# Patient Record
Sex: Female | Born: 1980 | Race: Asian | Hispanic: No | Marital: Single | Smoking: Never smoker
Health system: Southern US, Community
[De-identification: ages and names within clinical notes are randomized; demographics above are authoritative.]

## PROBLEM LIST (undated history)

## (undated) DIAGNOSIS — B181 Chronic viral hepatitis B without delta-agent: Secondary | ICD-10-CM

## (undated) HISTORY — PX: CYSTECTOMY: SUR359

## (undated) HISTORY — DX: Chronic viral hepatitis B without delta-agent: B18.1

## (undated) HISTORY — PX: DENTAL SURGERY: SHX609

---
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
## 2003-11-11 ENCOUNTER — Other Ambulatory Visit: Admission: RE | Admit: 2003-11-11 | Discharge: 2003-11-11 | Payer: Self-pay | Admitting: Gynecology

## 2003-11-12 ENCOUNTER — Ambulatory Visit (HOSPITAL_COMMUNITY): Admission: RE | Admit: 2003-11-12 | Discharge: 2003-11-12 | Payer: Self-pay | Admitting: Gynecology

## 2003-12-22 ENCOUNTER — Encounter (INDEPENDENT_AMBULATORY_CARE_PROVIDER_SITE_OTHER): Payer: Self-pay | Admitting: *Deleted

## 2003-12-22 ENCOUNTER — Inpatient Hospital Stay (HOSPITAL_COMMUNITY): Admission: RE | Admit: 2003-12-22 | Discharge: 2003-12-23 | Payer: Self-pay | Admitting: Gynecology

## 2004-12-05 ENCOUNTER — Inpatient Hospital Stay (HOSPITAL_COMMUNITY): Admission: AD | Admit: 2004-12-05 | Discharge: 2004-12-07 | Payer: Self-pay | Admitting: Obstetrics and Gynecology

## 2005-01-10 ENCOUNTER — Other Ambulatory Visit: Admission: RE | Admit: 2005-01-10 | Discharge: 2005-01-10 | Payer: Self-pay | Admitting: Obstetrics and Gynecology

## 2009-09-27 ENCOUNTER — Emergency Department (HOSPITAL_COMMUNITY): Admission: EM | Admit: 2009-09-27 | Discharge: 2009-09-27 | Payer: Self-pay | Admitting: Emergency Medicine

## 2010-05-27 NOTE — Discharge Summary (Signed)
Christina Schneider, Christina Schneider                  ACCOUNT NO.:  [id number]   MEDICAL RECORD NO.:  [id number]          PATIENT TYPE:  INP   LOCATION:  0451                         FACILITY:  [facility]   PHYSICIAN:  Howard C. Mezer, M.D.  DATE OF BIRTH:  January 19, 1980   DATE OF ADMISSION:  12/22/2003  DATE OF DISCHARGE:                                 DISCHARGE SUMMARY   HISTORY OF PRESENT ILLNESS:  The patient is a 30 year old with pelvic mass  for exploratory laparotomy on December 22, 2003.  The remainder of her  History and Physical are as previously dictated.   LABORATORY DATA AND X-RAY FINDINGS:  Preoperative hemoglobin 13.6, 5600  white blood cells.  Urinalysis within normal limits.   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  The patient did well postoperatively.  Diet and  ambulation were progressed over the evening of December 13, and early  morning of December 14.  On the morning of December 14, she was afebrile and  experiencing no problems except for pain and it was felt that she could be  discharged at this time.   DISCHARGE DIAGNOSES:  1.  Left hydrosalpinx.  2.  Left serocystic adenoma.   PROCEDURE:  Exploratory laparotomy with enterolysis and left salpingo-  oophorectomy.  Intraoperative pathology report revealed benign changes and  the diagnoses noted above.   DISPOSITION:  Discharged to home.   FOLLOW UP:  Return to the office in approximately 2 days for staple removal.   ACTIVITY:  She was instructed to gradually progress her activities over  several days at home and to limit lifting and driving x6 weeks.  She was  fully ambulatory.   DIET:  Regular.   CONDITION ON DISCHARGE:  Good.   DISCHARGE MEDICATIONS:  Prescription for Percocet generic #30, to be taken  1/2 to 1 q.4-6h. p.r.n. pain.     SRF/MEDQ  D:  12/23/2003  T:  12/23/2003  Job:  [id number]

## 2010-05-27 NOTE — H&P (Signed)
Christina Schneider, Christina Schneider                  ACCOUNT NO.:  [id number]   MEDICAL RECORD NO.:  [id number]          PATIENT TYPE:  INP   LOCATION:  NA                           FACILITY:  [facility]   PHYSICIAN:  Howard C. Mezer, M.D.  DATE OF BIRTH:  1980-02-08   DATE OF ADMISSION:  12/22/2003  DATE OF DISCHARGE:                                HISTORY & PHYSICAL   ADMITTING DIAGNOSIS:  Pelvic mass.   The patient is a 30 year old, nulligravid, Asian female, admitted with  pelvic mass for exploratory laparotomy.  The patient was first seen on  November 11, 2003, attempting to achieve pregnancy and on pelvic exam, a  pelvic mass was palpated.  Ultrasound examination revealed a complex left  adnexal mass, measuring 10.7 x 8.4 cm with solid and cystic components.  Assessment revealed an LDH of 149, quantitative hCG less than 2.0, AFP  marker at 3.1, a sedimentation rate of 5.  Prolactin 10.1 and CA 125 7.4.  The findings were discussed with Dr. Rande Brunt. Clarke-Pearson, and the  decision was made to schedule the patient for laparotomy for which he would  be available as a Artist.  A repeat ultrasound was performed on  December 17, 2003 and again, a complex cystic mass measuring 10.9 x 8.5 cm  was identified in the left adnexa.  An examination reveals no nodularity,  and the mass appears to be reasonably free.  Possible ovarian cancer,  possible mucinous cystadenoma have been discussed with the patient through  translators.  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  A TB test was obtained which was negative.  In a very  extensive conversation, the exploratory laparotomy with decision on incision-  type at the time of surgery, left salpingo-oophorectomy, question bilateral  salpingo-oophorectomy, question total abdominal hysterectomy have been  discussed in detail.  Potential complications including but not limited to  anesthesia; injury to the bowel, bladder,  ureters, possible fistula  formation, possible blood loss through transfusion, and its sequelae;  possible infection in the wound or the pelvis have been discussed in detail.  Hysterectomy may be necessary to remove the ovary or to deal with the  pathology or may become surgically necessary secondary to bleeding.  The  patient understands if a hysterectomy is performed that she will never be  able to become pregnant.  The patient accepts this possibility.  If the  lesion is cancerous, Dr. Reuel Boom L. Clarke-Pearson will be available to  perform the necessary procedures.  Postoperative expectations and  restrictions have been reviewed in detail.  Pain control has been discussed.  The translator at the preoperative visit was excellent.  There was a very  extended discussion about the details of the surgery and possible  consequences.  The patient understands that there is absolutely no guarantee  that she will be able to become pregnant in the future.  Bowel prep has been  discussed.  The patient appears to  have realistic expectations regarding  the surgery and understands the complexities of the surgery.  The  patient  understands that I will be leaving town the evening of the surgery.  This is  the only date that could be picked to accommodate Dr. Nelwyn Salisbury  schedule.  Dr. Jeanine Luz will provide postoperative care, and the patient  accepts this.   PAST MEDICAL HISTORY:   SURGICAL:  None.   MEDICAL:  Noncontributory.   ALLERGIES:  None known.   SOCIAL HISTORY:  The patient is employed second shift and attends [employer].  She  is a nonsmoker and does not use alcohol.   FAMILY HISTORY:  Positive for cardiovascular disease in the patient's mother  and negative for carcinoma.   PHYSICAL EXAMINATION:  HEENT:  Negative.  LUNGS:  Clear.  HEART:  Without murmurs.  BREASTS:  A small, milky discharge on the left, no masses and no nodes, and  no masses palpated.  ABDOMEN:  Soft and  nontender.  PELVIC EXAM:  BUS, vagina, and cervix normal.  The uterus is anterior and  mobile.  The right adnexa is negative, and the left adnexal area reveals a  large, mobile mass, and no nodularity is appreciated.  EXTREMITIES:  Negative.   IMPRESSION:  Pelvic mass.   PLAN:  Exploratory laparotomy with left salpingo-oophorectomy, question  total abdominal hysterectomy and bilateral salpingo-oophorectomy and  procedures as indicated by Dr. Rande Brunt. Clarke-Pearson if a malignancy is  identified.      HCM/MEDQ  D:  12/21/2003  T:  12/21/2003  Job:  [id number]

## 2010-05-27 NOTE — Op Note (Signed)
NAMENAVAEH, KEHRES                  ACCOUNT NO.:  [id number]   MEDICAL RECORD NO.:  [id number]          PATIENT TYPE:  INP   LOCATION:  0005                         FACILITY:  [facility]   PHYSICIAN:  Howard C. Mezer, M.D.  DATE OF BIRTH:  Nov 30, 1980   DATE OF PROCEDURE:  12/22/2003  DATE OF DISCHARGE:                                 OPERATIVE REPORT   PREOPERATIVE DIAGNOSIS:  Left adnexal mass.   POSTOPERATIVE DIAGNOSES:  1.  Left hydrosalpinx.  2.  Left ovarian cystadenoma.   OPERATION PERFORMED:  1.  Exploratory laparotomy.  2.  Left salpingo-oophorectomy.  3.  Lysis of adhesions.   SURGEON:  Dr. Teodora Medici   ASSISTANT:  Dr. Almedia Balls. Fore   ANESTHESIA:  General endotracheal.   PREPARATION:  Betadine.   DESCRIPTION OF PROCEDURE:  With the patient in the supine position, prepped  and draped in the routine fashion.  A Pfannenstiel incision was made through  the skin and subcutaneous tissue.  The fascia and peritoneum were opened  without difficulty.  Pelvic washings were obtained that were later  discarded.  A brief exploration of her upper abdomen revealed adhesions of  bowel and omentum to the anterior abdominal wall that were just very lightly  palpated so as not to disrupt these adhesion and cause bleeding.  Exploration of the pelvis revealed a large left cystic adnexal mass, densely  adherent to bowel, uterus, and pelvic sidewall.  The right ovary was  adherent to the pelvic sidewall but was otherwise normal.  The right  fallopian tube was not dilated; however, it was clubbed at the distal end,  and the distal end was adherent to the pelvic sidewall.  There were  significant adhesions in the pelvis, primarily in the cul-de-sac and  adherent to the posterior uterus.  It was very difficult at first to  establish normal anatomy and very slowly and carefully, the adhesions  aforementioned were taken down to expose the underside of the mass, the  lateral side of the mass,  and the superior side of the mass.  The pelvic  sidewall was then identifiable.  The round ligament was identifiable, and  the pelvic sidewall was opened from the round ligament to the  infundibulopelvic ligament.  The pelvic sidewall was opened, the sidewall  anatomy identified, the ureter identified, and traced to the level of the  uterine arteries.  The infundibulopelvic ligament was then isolated,  clamped, cut, and free tied with #1 chromic and suture ligated with #1  chromic.  At this point, the mass could be lifted superiorly and very slowly  and carefully, the adhesions were lysed.  The ureter was well out of harm's  way, and the uteroovarian ligament and fallopian tube were isolated as well  as possible.  A Heaney clamp was placed across the dissected area and the  specimen removed intact.  After the specimen was off the field, there was a  small leak.  The ovary was never definitely identified; however, there was a  mass inside the cystic mass that likely represented the ovary.  The pedicle  was suture ligated, tying fore and aft and because this represented the  vascular cornual area, two figure-of-eight sutures were placed over this  suture to assure hemostasis.  No effort was made to deal with the right  adnexal areas without causing the patient any adverse symptoms, and  permission had not been granted to perform a salpingectomy on the right  side.  Attention was then paid to hemostasis, primarily on the left pelvic  sidewall which was quite raw.  The ureter was again traced and multiple  bleeding points arrested, taking care not to damage the ureter or the bowel.  There was some very mild ooze that could not be arrested with cautery, and a  small piece of Gelfoam was placed.  This was placed away from the ureter,  and the peritoneum covered the ureter at this location.  At the completion  of the procedure with hemostasis intact, an effort was made to place the  large bowel in  the cul-de-sac.  There was minimal omentum to bring down.  The abdomen was then closed in layers using a running 2-0 Vicryl on the  peritoneum, running 0 Vicryl to midline bilaterally on the fascia.  Hemostasis was assured in the subcutaneous tissue, and the skin was closed  with staples.  The estimated blood loss was approximately 100 mL.  The  sponge, needle, and instrument counts were correct x 2.  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  The patient was given 100 mg of doxycycline intraoperatively, and  there were no signs of acute infection.  The patient tolerated the procedure  well and was taken to the recovery room in satisfactory condition.      HCM/MEDQ  D:  12/22/2003  T:  12/22/2003  Job:  [id number]

## 2011-02-01 ENCOUNTER — Encounter (HOSPITAL_COMMUNITY): Payer: Self-pay | Admitting: Emergency Medicine

## 2011-02-01 ENCOUNTER — Emergency Department (HOSPITAL_COMMUNITY)
Admission: EM | Admit: 2011-02-01 | Discharge: 2011-02-01 | Disposition: A | Discharge status: Home / Self Care | Payer: Commercial Managed Care - PPO | Source: Home / Self Care | Attending: Emergency Medicine | Admitting: Emergency Medicine

## 2011-02-01 DIAGNOSIS — J029 Acute pharyngitis, unspecified: Secondary | ICD-10-CM

## 2011-02-01 DIAGNOSIS — B085 Enteroviral vesicular pharyngitis: Secondary | ICD-10-CM

## 2011-02-01 MED ORDER — GUAIFENESIN-CODEINE 100-10 MG/5ML PO SYRP: 5.0000 mL | ORAL_SOLUTION | Freq: Three times a day (TID) | ORAL | Status: DC | PRN

## 2011-02-01 MED ORDER — BENZOCAINE-MENTHOL 6-10 MG MT LOZG: 1.0000 | LOZENGE | OROMUCOSAL | Status: AC | PRN

## 2011-02-01 MED ORDER — GUAIFENESIN-CODEINE 100-10 MG/5ML PO SYRP: 5.0000 mL | ORAL_SOLUTION | Freq: Three times a day (TID) | ORAL | Status: AC | PRN

## 2011-02-01 NOTE — ED Notes (Signed)
PT HERE WITH DRY COUGH,H/A AND FEVER THAT STARTED X 4 DYS AGO UNRELIEVED BY ROBITUSSIN OR DELSYM.VSS

## 2011-02-01 NOTE — ED Provider Notes (Addendum)
History     CSN: [id number]  Arrival date & time 02/01/11  7829   First MD Initiated Contact with Patient 02/01/11 0935      Chief Complaint  Patient presents with  . Cough  . Headache    (Consider location/radiation/quality/duration/timing/severity/associated sxs/prior treatment) HPI Comments: COUGH AND FEVER, sore throat"  X 4 days hurst to swallow, fevers started last night chest hurts when coughing" No SOB  Patient is a 31 y.o. female presenting with cough and headaches. The history is provided by the patient.  Cough This is a new problem. The current episode started more than 2 days ago. The problem occurs constantly. The problem has been gradually worsening. The cough is non-productive. Associated symptoms include chills, headaches, rhinorrhea and sore throat. Pertinent negatives include no myalgias, no shortness of breath and no wheezing. She has tried decongestants for the symptoms. The treatment provided no relief. She is not a smoker. Her past medical history is significant for asthma. Her past medical history does not include bronchitis.  Headache The primary symptoms include headaches.  The headache is not associated with neck stiffness.  Additional symptoms do not include neck stiffness.    History reviewed. No pertinent past medical history.  History reviewed. No pertinent past surgical history.  No family history on file.  History  Substance Use Topics  . Smoking status: Never Smoker   . Smokeless tobacco: Not on file  . Alcohol Use: No    OB History    Grav Para Term Preterm Abortions TAB SAB Ect Mult Living                  Review of Systems  Constitutional: Positive for chills.  HENT: Positive for congestion, sore throat and rhinorrhea. Negative for neck pain and neck stiffness.   Respiratory: Positive for cough. Negative for chest tightness, shortness of breath and wheezing.   Musculoskeletal: Negative for myalgias.  Neurological: Positive for  headaches.    Allergies  Review of patient's allergies indicates no known allergies.  Home Medications  No current outpatient prescriptions on file.  BP 112/81  Pulse 103  Temp(Src) 98.5 F (36.9 C) (Oral)  Resp 20  SpO2 100%  LMP 01/10/2011  Physical Exam  Nursing note and vitals reviewed. Constitutional: She appears well-developed and well-nourished. No distress.  HENT:  Head: Atraumatic.  Mouth/Throat: Uvula is midline. Posterior oropharyngeal edema present. No oropharyngeal exudate or tonsillar abscesses.    Eyes: Conjunctivae are normal. Right eye exhibits no discharge. Left eye exhibits no discharge.  Neck: Neck supple. No JVD present.  Pulmonary/Chest: No respiratory distress. She has no wheezes. She has no rales. She exhibits no tenderness.  Lymphadenopathy:    She has cervical adenopathy.  Skin: Skin is warm. No erythema.    ED Course  Procedures (including critical care time)   Labs Reviewed  POCT RAPID STREP A ([facility] CARE ONLY)   No results found.   No diagnosis found.    MDM  Pharyngitis with reactive LAD and ulcerative lesion soft palate.        Jimmie Molly, MD 02/01/11 1039  Jimmie Molly, MD 02/01/11 1049

## 2011-07-03 ENCOUNTER — Ambulatory Visit (INDEPENDENT_AMBULATORY_CARE_PROVIDER_SITE_OTHER): Payer: Commercial Managed Care - PPO | Admitting: Family Medicine

## 2011-07-03 VITALS — BP 107/74 | HR 69 | Temp 98.0°F | Resp 16 | Ht 60.0 in | Wt 106.8 lb

## 2011-07-03 DIAGNOSIS — M549 Dorsalgia, unspecified: Secondary | ICD-10-CM

## 2011-07-03 LAB — POCT CBC
Granulocyte percent: 67.7 %G (ref 37–80)
HCT, POC: 41.5 % (ref 37.7–47.9)
Hemoglobin: 13.7 g/dL (ref 12.2–16.2)
MCH, POC: 26.3 pg — AB (ref 27–31.2)
MCV: 79.7 fL — AB (ref 80–97)
POC Granulocyte: 5.6 (ref 2–6.9)
Platelet Count, POC: 265 10*3/uL (ref 142–424)
RBC: 5.21 M/uL (ref 4.04–5.48)
RDW, POC: 13.8 %
WBC: 8.3 10*3/uL (ref 4.6–10.2)

## 2011-07-03 NOTE — Progress Notes (Signed)
Patient Name: Christina Schneider Date of Birth: 1980/11/30 Medical Record Number: [id number] Gender: female Date of Encounter: 07/03/2011  History of Present Illness:  Christina Schneider is a 31 y.o. very pleasant female patient who presents with the following:  She would like a blood test for leg pain.  She notes "itching inside" her leg for a year or so- no rash. She feels that the itching is actually inside her leg.  She has also had intermittent leg pains for several years- she would like to have a blood test for this- ?exactly which test she would like to have.   She has been to [facility] ortho for this problem and has been given mobic which does help- she takes it as needed.  She has been told that she may have sciatics by a chiropractor.   There is no problem list on file for this patient.  No past medical history on file. No past surgical history on file. History  Substance Use Topics  . Smoking status: Never Smoker   . Smokeless tobacco: Not on file  . Alcohol Use: No   No family history on file. No Known Allergies  Medication list has been reviewed and updated.  Prior to Admission medications   Medication Sig Start Date End Date Taking? Authorizing Provider  meloxicam (MOBIC) 15 MG tablet Take 15 mg by mouth daily.   Yes Historical Provider, MD  benzocaine-menthol (CHLORASEPTIC) 6-10 MG lozenge Take 1 lozenge by mouth every 2 (two) hours as needed for pain. 02/01/11 02/01/12  Jimmie Molly, MD    Review of Systems:  As per HPI- otherwise negative.   Physical Examination: Filed Vitals:   07/03/11 1253  BP: 107/74  Pulse: 69  Temp: 98 F (36.7 C)  Resp: 16   Filed Vitals:   07/03/11 1253  Height: 5' (1.524 m)  Weight: 106 lb 12.8 oz (48.444 kg)   Body mass index is 20.86 kg/(m^2). Ideal Body Weight: Weight in (lb) to have BMI = 25: 127.7   GEN: WDWN, NAD, Non-toxic, A & O x 3 HEENT: Atraumatic, Normocephalic. Neck supple. No masses, No LAD. Ears and Nose: No  external deformity. CV: RRR, No M/G/R. No JVD. No thrill. No extra heart sounds. PULM: CTA B, no wheezes, crackles, rhonchi. No retractions. No resp. distress. No accessory muscle use. ABD: S, NT, ND, +BS. No rebound. No HSM. EXTR: No c/c/e NEURO Normal gait.  PSYCH: Normally interactive. Conversant. Not depressed or anxious appearing.  Calm demeanor.  Legs are normal with no swelling or tenderness. Back with normal ROM  Results for orders placed in visit on 07/03/11  POCT CBC      Component Value Range   WBC 8.3  4.6 - 10.2 K/uL   Lymph, poc 2.3  0.6 - 3.4   POC LYMPH PERCENT 27.3  10 - 50 %L   MID (cbc) 0.4  0 - 0.9   POC MID % 5.0  0 - 12 %M   POC Granulocyte 5.6  2 - 6.9   Granulocyte percent 67.7  37 - 80 %G   RBC 5.21  4.04 - 5.48 M/uL   Hemoglobin 13.7  12.2 - 16.2 g/dL   HCT, POC 62.1  30.8 - 47.9 %   MCV 79.7 (*) 80 - 97 fL   MCH, POC 26.3 (*) 27 - 31.2 pg   MCHC 33.0  31.8 - 35.4 g/dL   RDW, POC 65.7     Platelet Count, POC 265  142 - 424 K/uL   MPV 8.8  0 - 99.8 fL    Assessment and Plan: 1. Back pain  POCT CBC, Basic metabolic panel   Will check labs as above per her request.  Reassured her that I do not think her symptoms are due to her kidneys, but we do not mind checking a BMP if she likes.  She may continue to use mobic for likely MSK as needed  Abbe Amsterdam, MD

## 2011-07-04 ENCOUNTER — Encounter: Payer: Self-pay | Admitting: Family Medicine

## 2011-07-04 LAB — BASIC METABOLIC PANEL
CO2: 28 mEq/L (ref 19–32)
Calcium: 9.5 mg/dL (ref 8.4–10.5)
Chloride: 102 mEq/L (ref 96–112)
Glucose, Bld: 78 mg/dL (ref 70–99)
Potassium: 4.3 mEq/L (ref 3.5–5.3)
Sodium: 138 mEq/L (ref 135–145)

## 2013-01-09 NOTE — L&D Delivery Note (Signed)
Delivery Note At 3:28 PM a viable female was delivered via Vaginal, Spontaneous Delivery (Presentation: ; Right Occiput Anterior).  APGAR: 8, 9; weight .  Infant placed directly on mom's abdomen for bonding/skin-to-skin. Cord allowed to stop pulsing, and was clamped x 2, and cut by fob.   Placenta status: Intact, Spontaneous.  Cord: 3 vessels with the following complications: None.   Anesthesia: Epidural  Episiotomy: None Lacerations: None;Lt Periurethral- hemostatic w/o repair Suture Repair: n/a Est. Blood Loss (mL): 200  Mom to postpartum.  Baby to Couplet care / Skin to Skin. Plans to breast/bottlefeed, COCs for contraception  Marge DuncansBooker, Aubrionna Istre Randall 11/01/2013, 3:57 PM

## 2013-07-10 ENCOUNTER — Other Ambulatory Visit (HOSPITAL_COMMUNITY): Payer: Self-pay | Admitting: Physician Assistant

## 2013-07-10 DIAGNOSIS — Z3689 Encounter for other specified antenatal screening: Secondary | ICD-10-CM

## 2013-07-10 LAB — OB RESULTS CONSOLE HEPATITIS B SURFACE ANTIGEN: Hepatitis B Surface Ag: POSITIVE

## 2013-07-10 LAB — OB RESULTS CONSOLE GC/CHLAMYDIA
Chlamydia: NEGATIVE
GC PROBE AMP, GENITAL: NEGATIVE

## 2013-07-10 LAB — OB RESULTS CONSOLE ABO/RH: RH Type: POSITIVE

## 2013-07-10 LAB — OB RESULTS CONSOLE HIV ANTIBODY (ROUTINE TESTING): HIV: NONREACTIVE

## 2013-07-10 LAB — OB RESULTS CONSOLE RUBELLA ANTIBODY, IGM: Rubella: IMMUNE

## 2013-07-10 LAB — OB RESULTS CONSOLE RPR: RPR: NONREACTIVE

## 2013-07-10 LAB — OB RESULTS CONSOLE ANTIBODY SCREEN: Antibody Screen: NEGATIVE

## 2013-07-15 ENCOUNTER — Ambulatory Visit (HOSPITAL_COMMUNITY)
Admission: RE | Admit: 2013-07-15 | Discharge: 2013-07-15 | Disposition: A | Discharge status: Home / Self Care | Payer: Medicaid Other | Source: Ambulatory Visit | Attending: Physician Assistant | Admitting: Physician Assistant

## 2013-07-15 DIAGNOSIS — Z3689 Encounter for other specified antenatal screening: Secondary | ICD-10-CM

## 2013-09-18 ENCOUNTER — Other Ambulatory Visit (HOSPITAL_COMMUNITY): Payer: Self-pay | Admitting: Nurse Practitioner

## 2013-09-18 DIAGNOSIS — Z0374 Encounter for suspected problem with fetal growth ruled out: Secondary | ICD-10-CM

## 2013-09-23 ENCOUNTER — Ambulatory Visit (HOSPITAL_COMMUNITY)
Admission: RE | Admit: 2013-09-23 | Discharge: 2013-09-23 | Disposition: A | Discharge status: Home / Self Care | Payer: Medicaid Other | Source: Ambulatory Visit | Attending: Nurse Practitioner | Admitting: Nurse Practitioner

## 2013-09-23 DIAGNOSIS — Z0374 Encounter for suspected problem with fetal growth ruled out: Secondary | ICD-10-CM | POA: Diagnosis not present

## 2013-09-23 DIAGNOSIS — O26849 Uterine size-date discrepancy, unspecified trimester: Secondary | ICD-10-CM | POA: Diagnosis present

## 2013-10-09 LAB — OB RESULTS CONSOLE GBS: GBS: NEGATIVE

## 2013-11-01 ENCOUNTER — Inpatient Hospital Stay (HOSPITAL_COMMUNITY)
Admission: AD | Admit: 2013-11-01 | Discharge: 2013-11-02 | DRG: 775 | Disposition: A | Discharge status: Home / Self Care | Payer: Medicaid Other | Source: Ambulatory Visit | Attending: Obstetrics & Gynecology | Admitting: Obstetrics & Gynecology

## 2013-11-01 ENCOUNTER — Inpatient Hospital Stay (HOSPITAL_COMMUNITY): Payer: Medicaid Other | Admitting: Anesthesiology

## 2013-11-01 ENCOUNTER — Encounter (HOSPITAL_COMMUNITY): Payer: Self-pay

## 2013-11-01 ENCOUNTER — Encounter (HOSPITAL_COMMUNITY): Payer: Medicaid Other | Admitting: Anesthesiology

## 2013-11-01 DIAGNOSIS — Z3A39 39 weeks gestation of pregnancy: Secondary | ICD-10-CM | POA: Diagnosis present

## 2013-11-01 DIAGNOSIS — O99824 Streptococcus B carrier state complicating childbirth: Principal | ICD-10-CM | POA: Diagnosis present

## 2013-11-01 DIAGNOSIS — O0933 Supervision of pregnancy with insufficient antenatal care, third trimester: Secondary | ICD-10-CM

## 2013-11-01 DIAGNOSIS — O471 False labor at or after 37 completed weeks of gestation: Secondary | ICD-10-CM | POA: Diagnosis present

## 2013-11-01 DIAGNOSIS — IMO0001 Reserved for inherently not codable concepts without codable children: Secondary | ICD-10-CM

## 2013-11-01 LAB — CBC
HEMATOCRIT: 36 % (ref 36.0–46.0)
HEMOGLOBIN: 12.3 g/dL (ref 12.0–15.0)
MCH: 26.6 pg (ref 26.0–34.0)
MCHC: 34.2 g/dL (ref 30.0–36.0)
MCV: 77.9 fL — ABNORMAL LOW (ref 78.0–100.0)
Platelets: 175 10*3/uL (ref 150–400)
RBC: 4.62 MIL/uL (ref 3.87–5.11)
RDW: 14.6 % (ref 11.5–15.5)
WBC: 11 10*3/uL — ABNORMAL HIGH (ref 4.0–10.5)

## 2013-11-01 LAB — RPR

## 2013-11-01 LAB — TYPE AND SCREEN
ABO/RH(D): A POS
Antibody Screen: NEGATIVE

## 2013-11-01 LAB — ABO/RH: ABO/RH(D): A POS

## 2013-11-01 MED ORDER — LACTATED RINGERS IV SOLN
500.0000 mL | INTRAVENOUS | Status: DC | PRN
  Administered 2013-11-01: 500 mL via INTRAVENOUS

## 2013-11-01 MED ORDER — IBUPROFEN 600 MG PO TABS
600.0000 mg | ORAL_TABLET | Freq: Four times a day (QID) | ORAL | Status: DC
  Administered 2013-11-01 – 2013-11-02 (×3): 600 mg via ORAL
  Filled 2013-11-01 (×4): qty 1

## 2013-11-01 MED ORDER — EPHEDRINE 5 MG/ML INJ: 10.0000 mg | INTRAVENOUS | Status: DC | PRN

## 2013-11-01 MED ORDER — SODIUM CHLORIDE 0.9 % IV SOLN: 14.0000 mL/h | INTRAVENOUS | Status: DC | PRN

## 2013-11-01 MED ORDER — OXYTOCIN 40 UNITS IN LACTATED RINGERS INFUSION - SIMPLE MED: 62.5000 mL/h | INTRAVENOUS | Status: DC | PRN

## 2013-11-01 MED ORDER — OXYCODONE-ACETAMINOPHEN 5-325 MG PO TABS: 2.0000 | ORAL_TABLET | ORAL | Status: DC | PRN

## 2013-11-01 MED ORDER — SODIUM CHLORIDE 0.9 % IV SOLN: 250.0000 mL | INTRAVENOUS | Status: DC | PRN

## 2013-11-01 MED ORDER — MEASLES, MUMPS & RUBELLA VAC ~~LOC~~ INJ
0.5000 mL | INJECTION | Freq: Once | SUBCUTANEOUS | Status: DC
  Filled 2013-11-01: qty 0.5

## 2013-11-01 MED ORDER — LIDOCAINE HCL (PF) 1 % IJ SOLN
30.0000 mL | INTRAMUSCULAR | Status: DC | PRN
  Filled 2013-11-01: qty 30

## 2013-11-01 MED ORDER — OXYCODONE-ACETAMINOPHEN 5-325 MG PO TABS: 1.0000 | ORAL_TABLET | ORAL | Status: DC | PRN

## 2013-11-01 MED ORDER — SENNOSIDES-DOCUSATE SODIUM 8.6-50 MG PO TABS
2.0000 | ORAL_TABLET | ORAL | Status: DC
  Administered 2013-11-01: 2 via ORAL
  Filled 2013-11-01: qty 2

## 2013-11-01 MED ORDER — EPHEDRINE 5 MG/ML INJ
10.0000 mg | INTRAVENOUS | Status: DC | PRN
  Filled 2013-11-01: qty 2

## 2013-11-01 MED ORDER — BENZOCAINE-MENTHOL 20-0.5 % EX AERO: 1.0000 "application " | INHALATION_SPRAY | CUTANEOUS | Status: DC | PRN

## 2013-11-01 MED ORDER — DIPHENHYDRAMINE HCL 50 MG/ML IJ SOLN: 12.5000 mg | INTRAMUSCULAR | Status: DC | PRN

## 2013-11-01 MED ORDER — ONDANSETRON HCL 4 MG PO TABS: 4.0000 mg | ORAL_TABLET | ORAL | Status: DC | PRN

## 2013-11-01 MED ORDER — ONDANSETRON HCL 4 MG/2ML IJ SOLN: 4.0000 mg | INTRAMUSCULAR | Status: DC | PRN

## 2013-11-01 MED ORDER — OXYTOCIN BOLUS FROM INFUSION
500.0000 mL | INTRAVENOUS | Status: DC
  Administered 2013-11-01: 500 mL via INTRAVENOUS

## 2013-11-01 MED ORDER — PRENATAL MULTIVITAMIN CH
1.0000 | ORAL_TABLET | Freq: Every day | ORAL | Status: DC
  Administered 2013-11-02: 1 via ORAL
  Filled 2013-11-01: qty 1

## 2013-11-01 MED ORDER — LACTATED RINGERS IV SOLN
INTRAVENOUS | Status: DC
  Administered 2013-11-01: 12:00:00 via INTRAVENOUS

## 2013-11-01 MED ORDER — LACTATED RINGERS IV SOLN: 500.0000 mL | Freq: Once | INTRAVENOUS | Status: DC

## 2013-11-01 MED ORDER — FLEET ENEMA 7-19 GM/118ML RE ENEM: 1.0000 | ENEMA | Freq: Every day | RECTAL | Status: DC | PRN

## 2013-11-01 MED ORDER — TETANUS-DIPHTH-ACELL PERTUSSIS 5-2.5-18.5 LF-MCG/0.5 IM SUSP: 0.5000 mL | Freq: Once | INTRAMUSCULAR | Status: DC

## 2013-11-01 MED ORDER — FENTANYL CITRATE 0.05 MG/ML IJ SOLN
100.0000 ug | INTRAMUSCULAR | Status: DC | PRN
  Administered 2013-11-01: 100 ug via INTRAVENOUS
  Filled 2013-11-01: qty 2

## 2013-11-01 MED ORDER — CITRIC ACID-SODIUM CITRATE 334-500 MG/5ML PO SOLN: 30.0000 mL | ORAL | Status: DC | PRN

## 2013-11-01 MED ORDER — FENTANYL 2.5 MCG/ML BUPIVACAINE 1/10 % EPIDURAL INFUSION (WH - ANES)
14.0000 mL/h | INTRAMUSCULAR | Status: DC | PRN
  Administered 2013-11-01: 14 mL/h via EPIDURAL
  Filled 2013-11-01: qty 125

## 2013-11-01 MED ORDER — WITCH HAZEL-GLYCERIN EX PADS: 1.0000 "application " | MEDICATED_PAD | CUTANEOUS | Status: DC | PRN

## 2013-11-01 MED ORDER — PHENYLEPHRINE 40 MCG/ML (10ML) SYRINGE FOR IV PUSH (FOR BLOOD PRESSURE SUPPORT)
80.0000 ug | PREFILLED_SYRINGE | INTRAVENOUS | Status: DC | PRN
  Filled 2013-11-01: qty 10
  Filled 2013-11-01: qty 2

## 2013-11-01 MED ORDER — SODIUM CHLORIDE 0.9 % IJ SOLN
3.0000 mL | Freq: Two times a day (BID) | INTRAMUSCULAR | Status: DC
  Administered 2013-11-01: 10 mL via INTRAVENOUS

## 2013-11-01 MED ORDER — SIMETHICONE 80 MG PO CHEW: 80.0000 mg | CHEWABLE_TABLET | ORAL | Status: DC | PRN

## 2013-11-01 MED ORDER — LIDOCAINE HCL (PF) 1 % IJ SOLN
INTRAMUSCULAR | Status: DC | PRN
  Administered 2013-11-01 (×2): 5 mL

## 2013-11-01 MED ORDER — DIPHENHYDRAMINE HCL 25 MG PO CAPS: 25.0000 mg | ORAL_CAPSULE | Freq: Four times a day (QID) | ORAL | Status: DC | PRN

## 2013-11-01 MED ORDER — BISACODYL 10 MG RE SUPP: 10.0000 mg | Freq: Every day | RECTAL | Status: DC | PRN

## 2013-11-01 MED ORDER — DIBUCAINE 1 % RE OINT: 1.0000 "application " | TOPICAL_OINTMENT | RECTAL | Status: DC | PRN

## 2013-11-01 MED ORDER — ZOLPIDEM TARTRATE 5 MG PO TABS: 5.0000 mg | ORAL_TABLET | Freq: Every evening | ORAL | Status: DC | PRN

## 2013-11-01 MED ORDER — PHENYLEPHRINE 40 MCG/ML (10ML) SYRINGE FOR IV PUSH (FOR BLOOD PRESSURE SUPPORT)
80.0000 ug | PREFILLED_SYRINGE | INTRAVENOUS | Status: DC | PRN
  Filled 2013-11-01: qty 2

## 2013-11-01 MED ORDER — PHENYLEPHRINE 40 MCG/ML (10ML) SYRINGE FOR IV PUSH (FOR BLOOD PRESSURE SUPPORT): 80.0000 ug | PREFILLED_SYRINGE | INTRAVENOUS | Status: DC | PRN

## 2013-11-01 MED ORDER — OXYTOCIN 40 UNITS IN LACTATED RINGERS INFUSION - SIMPLE MED
62.5000 mL/h | INTRAVENOUS | Status: DC
  Filled 2013-11-01: qty 1000

## 2013-11-01 MED ORDER — ONDANSETRON HCL 4 MG/2ML IJ SOLN: 4.0000 mg | Freq: Four times a day (QID) | INTRAMUSCULAR | Status: DC | PRN

## 2013-11-01 MED ORDER — LANOLIN HYDROUS EX OINT: TOPICAL_OINTMENT | CUTANEOUS | Status: DC | PRN

## 2013-11-01 MED ORDER — ACETAMINOPHEN 325 MG PO TABS: 650.0000 mg | ORAL_TABLET | ORAL | Status: DC | PRN

## 2013-11-01 MED ORDER — SODIUM CHLORIDE 0.9 % IJ SOLN: 3.0000 mL | INTRAMUSCULAR | Status: DC | PRN

## 2013-11-01 NOTE — Anesthesia Preprocedure Evaluation (Addendum)
Anesthesia Evaluation  Patient identified by MRN, date of birth, ID band Patient awake    Reviewed: Allergy & Precautions, H&P , Patient's Chart, lab work & pertinent test results  Airway Mallampati: III TM Distance: >3 FB Neck ROM: full    Dental   Pulmonary  breath sounds clear to auscultation        Cardiovascular Rhythm:regular Rate:Normal     Neuro/Psych    GI/Hepatic (+) B  Endo/Other    Renal/GU      Musculoskeletal   Abdominal   Peds  Hematology   Anesthesia Other Findings   Reproductive/Obstetrics (+) Pregnancy                          Anesthesia Physical Anesthesia Plan  ASA: III  Anesthesia Plan: Epidural   Post-op Pain Management:    Induction:   Airway Management Planned:   Additional Equipment:   Intra-op Plan:   Post-operative Plan:   Informed Consent: I have reviewed the patients History and Physical, chart, labs and discussed the procedure including the risks, benefits and alternatives for the proposed anesthesia with the patient or authorized representative who has indicated his/her understanding and acceptance.     Plan Discussed with:   Anesthesia Plan Comments:         Anesthesia Quick Evaluation

## 2013-11-01 NOTE — Progress Notes (Signed)
Dr. Debroah LoopArnold answered for [facility], CNM. Notified of pt's cervical exam, u/c's q3, will admit to birthing suites.

## 2013-11-01 NOTE — MAU Note (Signed)
Per HMitchell, RN charge, pt to go to room 164. EFM tracing reviewed.

## 2013-11-01 NOTE — Anesthesia Procedure Notes (Signed)
Epidural Patient location during procedure: OB Start time: 11/01/2013 1:37 PM  Staffing Anesthesiologist: Brayton CavesJACKSON, Morayo Leven Performed by: anesthesiologist   Preanesthetic Checklist Completed: patient identified, site marked, surgical consent, pre-op evaluation, timeout performed, IV checked, risks and benefits discussed and monitors and equipment checked  Epidural Patient position: sitting Prep: site prepped and draped and DuraPrep Patient monitoring: continuous pulse ox and blood pressure Approach: midline Location: L3-L4 Injection technique: LOR air  Needle:  Needle type: Tuohy  Needle gauge: 17 G Needle length: 9 cm and 9 Needle insertion depth: 3 cm Catheter type: closed end flexible Catheter size: 19 Gauge Catheter at skin depth: 10 cm Test dose: negative  Assessment Events: blood not aspirated, injection not painful, no injection resistance, negative IV test and no paresthesia  Additional Notes Patient identified.  Risk benefits discussed including failed block, incomplete pain control, headache, nerve damage, paralysis, blood pressure changes, nausea, vomiting, reactions to medication both toxic or allergic, and postpartum back pain.  Patient expressed understanding and wished to proceed.  All questions were answered.  Sterile technique used throughout procedure and epidural site dressed with sterile barrier dressing. No paresthesia or other complications noted.The patient did not experience any signs of intravascular injection such as tinnitus or metallic taste in mouth nor signs of intrathecal spread such as rapid motor block. Please see nursing notes for vital signs.

## 2013-11-01 NOTE — H&P (Signed)
Christina Schneider is a 33 y.o. 442P1001 female at 5378w2d by LMP c/w 23wk u/s, presenting in active labor.   Reports active fetal movement, contractions: regular, vaginal bleeding: none, membranes: intact. Initiated [facility] care at [facility] at 23 wks.   Pregnancy complicated by HBsAg Pos, Hgb E trait pos, late care @ 23wks.  Past Medical History: Past Medical History  Diagnosis Date  . Hepatitis B carrier     Past Surgical History: Past Surgical History  Procedure Laterality Date  . Dental surgery    . Cystectomy      Obstetrical History: OB History   Grav Para Term Preterm Abortions TAB SAB Ect Mult Living   2 1 1       1       Social History: History   Social History  . Marital Status: Single    Spouse Name: N/A    Number of Children: N/A  . Years of Education: N/A   Social History Main Topics  . Smoking status: Never Smoker   . Smokeless tobacco: Never Used  . Alcohol Use: No  . Drug Use: No  . Sexual Activity: Yes    Birth Control/ Protection: None   Other Topics Concern  . None   Social History Narrative  . None    Family History: History reviewed. No pertinent family history.  Allergies: No Known Allergies  Prescriptions prior to admission  Medication Sig Dispense Refill  . Prenatal Vit-Fe Fumarate-FA (PRENATAL MULTIVITAMIN) TABS tablet Take 1 tablet by mouth daily at 12 noon.         Review of Systems  Pertinent pos/neg as indicated in HPI    Height 5' (1.524 m), weight 57.38 kg (126 lb 8 oz). General appearance: alert, cooperative and no distress Lungs: clear to auscultation bilaterally Heart: regular rate and rhythm Abdomen: gravid, soft, non-tender Extremities: No edema DTR's 2+  Fetal monitoring: FHR: 135 bpm, variability: moderate,  Accelerations: Present,  decelerations:  Absent Uterine activity: q 2-534mins  Dilation: 3 Effacement (%): 90 Station: -2 Exam by:: Principal FinancialSBeck, RN Presentation: cephalic  1305: 5/90/-2, bbow   Prenatal  labs: ABO, Rh:  A+ Antibody:  neg Rubella:  immune RPR:   neg HBsAg:   POS HIV:   non-reactive GBS:   negative  1 hr Glucola: 85 Genetic screening:  Too late Anatomy US: normal  Assessment:  6378w2d SIUP  G2P1001  Active labor   Cat 1 FHR  GBS  neg  Hep B carrier  Hgb E trait +  Plan:  Admit to BS  IV pain meds/epidural prn active labor  Expectant management  Anticipate NSVB   Plans to breast/bottlefeed  Contraception: POPs  Circumcision: n/a  Marge DuncansBooker, Kimberly Randall CNM, WHNP-BC 11/01/2013, 12:21 PM

## 2013-11-01 NOTE — MAU Note (Signed)
Pt states here for eval of contractions q3-5 minutes apart. No leaking of fluid. Bloody show.

## 2013-11-02 NOTE — Anesthesia Postprocedure Evaluation (Signed)
Anesthesia Post Note  Patient: Christina Schneider  Procedure(s) Performed: * No procedures listed *  Anesthesia type: Epidural  Patient location: Mother/Baby  Post pain: Pain level controlled  Post assessment: Post-op Vital signs reviewed  Last Vitals:  Filed Vitals:   11/02/13 0715  BP: 101/65  Pulse: 63  Temp: 36.6 C  Resp: 17    Post vital signs: Reviewed  Level of consciousness:alert  Complications: No apparent anesthesia complications

## 2013-11-02 NOTE — Lactation Note (Signed)
This note was copied from the chart of Girl Shantella Aronoff. Lactation Consultation Note  Patient Name: Girl Norton Pastelyi Croslin Today's Date: 11/02/2013 Reason for consult: Initial assessment Baby 22 hours of life. Mom states that she nursed and formula fed her first child and she is planning to do the same for this child. Enc mom to offer lots of STS and nurse with cues. [facility] given [facility], aware of OP/BFSG, community resources, and [facility] phone line services.   Maternal Data Does the patient have breastfeeding experience prior to this delivery?: Yes  Feeding Feeding Type: Bottle Fed - Formula Nipple Type: Slow - flow  LATCH Score/Interventions                      Lactation Tools Discussed/Used     Consult Status Consult Status: PRN    Geralynn OchsWILLIARD, Carlous Olivares 11/02/2013, 2:14 PM

## 2013-11-02 NOTE — Discharge Summary (Signed)
Obstetric Discharge Summary Reason for Admission: onset of labor Prenatal Procedures: none Intrapartum Procedures: spontaneous vaginal delivery Postpartum Procedures: none Complications-Operative and Postpartum: none  Hospital Course:  Patient admitted in active labor, progressed quickly without complication.   Delivery Note At 3:28 PM a viable female was delivered via Vaginal, Spontaneous Delivery (Presentation: ; Right Occiput Anterior). APGAR: 8, 9; weight . Infant placed directly on mom's abdomen for bonding/skin-to-skin. Cord allowed to stop pulsing, and was clamped x 2, and cut by fob.  Placenta status: Intact, Spontaneous. Cord: 3 vessels with the following complications: None.  Anesthesia: Epidural  Episiotomy: None  Lacerations: None;Lt Periurethral- hemostatic w/o repair  Suture Repair: n/a  Est. Blood Loss (mL): 200  Mom to postpartum. Baby to Couplet care / Skin to Skin.  Plans to breast/bottlefeed, COCs for contraception  Marge DuncansBooker, Keiyon Plack Randall  11/01/2013, 3:57 PM  Postpartum, the patient did well with no complications.  On the day of discharge,  Pt denied problems with ambulating, voiding or po intake.  She denied nausea or vomiting.  Pain wass well controlled.  She has had flatus. She has not had bowel movement.  Lochia Small.  Plan for birth control is oral contraceptives (estrogen/progesterone).    H/H: Lab Results  Component Value Date/Time   HGB 12.3 11/01/2013 12:16 PM   HGB 13.7 07/03/2011  1:43 PM   HCT 36.0 11/01/2013 12:16 PM   HCT 41.5 07/03/2011  1:43 PM    Filed Vitals:   11/01/13 2330  BP: 107/62  Pulse: 67  Temp: 98.1 F (36.7 C)  Resp: 18    Physical Exam: VSS NAD Abd: Appropriately tender, ND, Fundus firm No c/c/e, Neg homan's sign, neg cords Lochia Appropriate  Discharge Diagnoses: Term Pregnancy-delivered  Discharge Information: Date: 11/02/2013 Activity: pelvic rest Diet: routine  Medications: PNV Breast feeding:   Yes Condition: stable Instructions: refer to handout Discharge to: home      Medication List         prenatal multivitamin Tabs tablet  Take 1 tablet by mouth daily at 12 noon.           Follow-up Information   [facility] [place]. Schedule an appointment as soon as possible for a visit in 6 weeks. (for postpartum follow up)    Contact information:   [address] [place] [id number] [phone]      Jacquiline DoeParker, Caleb 11/02/2013,6:58 AM  I spoke with and examined patient and agree with resident/PA/SNM's note and plan of care.  Contraception will be POPs as pt is breastfeeding. Desires d/c today after baby's 24hr labs.  Cheral MarkerKimberly R. Sherryann Frese, CNM, [facility] 11/02/2013 7:51 AM

## 2013-11-02 NOTE — Discharge Instructions (Signed)

## 2013-11-03 NOTE — Progress Notes (Signed)
Ur chart review completed.  

## 2013-11-10 ENCOUNTER — Encounter (HOSPITAL_COMMUNITY): Payer: Self-pay

## 2016-07-19 ENCOUNTER — Ambulatory Visit (INDEPENDENT_AMBULATORY_CARE_PROVIDER_SITE_OTHER): Payer: Self-pay

## 2016-07-19 ENCOUNTER — Ambulatory Visit (INDEPENDENT_AMBULATORY_CARE_PROVIDER_SITE_OTHER): Payer: Self-pay | Admitting: Physician Assistant

## 2016-07-19 ENCOUNTER — Encounter: Payer: Self-pay | Admitting: Physician Assistant

## 2016-07-19 VITALS — BP 124/79 | HR 75 | Temp 98.0°F | Resp 17 | Ht 60.5 in | Wt 112.0 lb

## 2016-07-19 DIAGNOSIS — R0602 Shortness of breath: Secondary | ICD-10-CM

## 2016-07-19 DIAGNOSIS — F458 Other somatoform disorders: Secondary | ICD-10-CM

## 2016-07-19 DIAGNOSIS — R0989 Other specified symptoms and signs involving the circulatory and respiratory systems: Secondary | ICD-10-CM

## 2016-07-19 DIAGNOSIS — R0789 Other chest pain: Secondary | ICD-10-CM

## 2016-07-19 LAB — POCT CBC
Granulocyte percent: 61.4 %G (ref 37–80)
HEMATOCRIT: 39.3 % (ref 37.7–47.9)
Hemoglobin: 13.1 g/dL (ref 12.2–16.2)
Lymph, poc: 1.7 (ref 0.6–3.4)
MCH, POC: 26.5 pg — AB (ref 27–31.2)
MCHC: 33.3 g/dL (ref 31.8–35.4)
MCV: 79.6 fL — AB (ref 80–97)
MID (cbc): 0.2 (ref 0–0.9)
MPV: 7.7 fL (ref 0–99.8)
PLATELET COUNT, POC: 231 10*3/uL (ref 142–424)
POC Granulocyte: 3.1 (ref 2–6.9)
POC LYMPH %: 33.9 % (ref 10–50)
POC MID %: 4.7 %M (ref 0–12)
RBC: 4.94 M/uL (ref 4.04–5.48)
RDW, POC: 12.9 %
WBC: 5.1 10*3/uL (ref 4.6–10.2)

## 2016-07-19 MED ORDER — RANITIDINE HCL 150 MG PO TABS: 150.0000 mg | ORAL_TABLET | Freq: Two times a day (BID) | ORAL | 0 refills | Status: DC

## 2016-07-19 NOTE — Progress Notes (Signed)
Patient ID: Christina Schneider, female     DOB: 05/05/1980, 36 y.o.    MRN: [id number]  PCP: Teodora MediciMezer, Howard, MD  Chief Complaint  Patient presents with  . Shortness of Breath    Subjective:   This patient is new to this practice and presents for evaluation of shortness of breath. She is accompanied by her 34 year old daughter.  SOB began this morning, noted upon waking. She used an albuterol inhaler prescribed 5-6 months ago for an illness accompanied by wheezing, and found it unhelpful. She describes the SOB as a "heaviness" in the chest.  She denies URI-type symptoms, cough, HA, dizziness, nausea, vomiting, diarrhea, urinary symptoms, joint pain. She initially reported no muscle pain, but then related RIGHT sided low back pain sometimes at night/early morning while lying down, that resolves when she gets up and moving.  Quit birth control about 4 months ago. LMP ended yesterday.   Review of Systems  Constitutional: Negative.   HENT: Negative.   Eyes: Negative.   Respiratory: Positive for shortness of breath. Negative for apnea, cough, choking, chest tightness, wheezing and stridor.   Cardiovascular: Negative.   Gastrointestinal: Negative.   Endocrine: Negative.   Genitourinary: Negative.   Musculoskeletal: Positive for back pain. Negative for arthralgias, gait problem, joint swelling, myalgias, neck pain and neck stiffness.  Skin: Negative.   Allergic/Immunologic: Negative.   Neurological: Negative.   Hematological: Negative.   Psychiatric/Behavioral: Negative.      Prior to Admission medications   Not on File     No Known Allergies   There are no active problems to display for this patient.    No family history on file.   Social History   Social History  . Marital status: Single    Spouse name: N/A  . Number of children: 2  . Years of education: N/A   Occupational History  . [employer]   Social History Main Topics  . Smoking status: Never  Smoker  . Smokeless tobacco: Never Used  . Alcohol use No  . Drug use: No  . Sexual activity: Yes    Birth control/ protection: None   Other Topics Concern  . Not on file   Social History Narrative   Originally from [place].   Came to the [place] in 2002.   Lives with her boyfriend and her two daughters.         Objective:  Physical Exam  Constitutional: She is oriented to person, place, and time. She appears well-developed and well-nourished. She is active and cooperative. No distress.  BP 124/79   Pulse 75   Temp 98 F (36.7 C) (Oral)   Resp 17   Ht 5' 0.5" (1.537 m)   Wt 112 lb (50.8 kg)   LMP 07/18/2016 (Approximate)   SpO2 98%   Breastfeeding? No   BMI 21.51 kg/m   HENT:  Head: Normocephalic and atraumatic.  Right Ear: Hearing and external ear normal.  Left Ear: Hearing and external ear normal.  Nose: Nose normal.  Mouth/Throat: Oropharynx is clear and moist.  Eyes: Conjunctivae and EOM are normal. Pupils are equal, round, and reactive to light. No scleral icterus.  Neck: Normal range of motion. Neck supple. No thyromegaly present.  Cardiovascular: Normal rate, regular rhythm and normal heart sounds.   Pulses:      Radial pulses are 2+ on the right side, and 2+ on the left side.  Pulmonary/Chest: Effort normal and breath sounds normal.  Abdominal:  Soft. Bowel sounds are normal. She exhibits no distension and no mass. There is no tenderness. There is no rebound and no guarding.  Musculoskeletal:       Back:  Lymphadenopathy:       Head (right side): No tonsillar, no preauricular, no posterior auricular and no occipital adenopathy present.       Head (left side): No tonsillar, no preauricular, no posterior auricular and no occipital adenopathy present.    She has no cervical adenopathy.       Right: No supraclavicular adenopathy present.       Left: No supraclavicular adenopathy present.  Neurological: She is alert and oriented to person, place, and time. No  sensory deficit.  Skin: Skin is warm, dry and intact. No rash noted. No cyanosis or erythema. Nails show no clubbing.  Psychiatric: She has a normal mood and affect. Her speech is normal and behavior is normal.    EKG reviewed with Dr. Alvy Bimler. NSR. No ischemia or LVH. Rate 71 bpm. PR 156. QT 408. No previous tracings for comparison.   Results for orders placed or performed in visit on 07/19/16  POCT CBC  Result Value Ref Range   WBC 5.1 4.6 - 10.2 K/uL   Lymph, poc 1.7 0.6 - 3.4   POC LYMPH PERCENT 33.9 10 - 50 %L   MID (cbc) 0.2 0 - 0.9   POC MID % 4.7 0 - 12 %M   POC Granulocyte 3.1 2 - 6.9   Granulocyte percent 61.4 37 - 80 %G   RBC 4.94 4.04 - 5.48 M/uL   Hemoglobin 13.1 12.2 - 16.2 g/dL   HCT, POC 16.1 09.6 - 47.9 %   MCV 79.6 (A) 80 - 97 fL   MCH, POC 26.5 (A) 27 - 31.2 pg   MCHC 33.3 31.8 - 35.4 g/dL   RDW, POC 04.5 %   Platelet Count, POC 231 142 - 424 K/uL   MPV 7.7 0 - 99.8 fL    Dg Chest 2 View  Result Date: 07/19/2016 CLINICAL DATA:  Shortness of Breath EXAM: CHEST  2 VIEW COMPARISON:  None. FINDINGS: There is a granuloma in the left lower lobe. No edema or consolidation. Heart size and pulmonary vascularity are normal. No adenopathy. No bone lesions. IMPRESSION: Small granuloma left lower lobe.  No edema or consolidation. Electronically Signed   By: Bretta Bang III M.D.   On: 07/19/2016 15:24        Assessment & Plan:  1. Shortness of breath 2. Chest heaviness Normal EKG, CBC and chest radiographs. Low risk for PE, cardiac cause. Anticipate resolution of symptoms without cause identified. Instructed to return here or go to the ED if her symptoms worsen or persist. - DG Chest 2 View; Future - POCT CBC - EKG 12-Lead  3. Globus sensation After I reviewed the results of her evaluation, and advised there is no identified cause of her symptoms (SOB and chest heaviness), she related that she is experiencing the sensation that something is stuck in her  throat, and that is what she was trying to describe. Trial of H2 blocker. RTC if symptoms worsen/persist. - ranitidine (ZANTAC) 150 MG tablet; Take 1 tablet (150 mg total) by mouth 2 (two) times daily.  Dispense: 60 tablet; Refill: 0    Return if symptoms worsen or fail to improve.   Fernande Bras, PA-C Primary Care at [facility]

## 2016-07-19 NOTE — Patient Instructions (Addendum)
The electrocardiogram is NORMAL. The blood count is NORMAL. The chest radiographs show no cause for your pain. There is a granuloma in the left lower lung, most likely scarring from a previous infection.  Please rest and drink plenty of water. If the symptoms persist, or worsen, please return here or go to the emergency room.   IF you received an x-ray today, you will receive an invoice from [facility]. Please contact [facility] at [phone] with questions or concerns regarding your invoice.   IF you received labwork today, you will receive an invoice from [facility]. Please contact [facility] at [phone] with questions or concerns regarding your invoice.   Our billing staff will not be able to assist you with questions regarding bills from these companies.  You will be contacted with the lab results as soon as they are available. The fastest way to get your results is to activate your My Chart account. Instructions are located on the last page of this paperwork. If you have not heard from us regarding the results in 2 weeks, please contact this office.

## 2017-06-11 ENCOUNTER — Encounter: Payer: Self-pay | Admitting: Physician Assistant

## 2017-06-11 ENCOUNTER — Ambulatory Visit (INDEPENDENT_AMBULATORY_CARE_PROVIDER_SITE_OTHER): Payer: Self-pay

## 2017-06-11 ENCOUNTER — Ambulatory Visit: Payer: Self-pay | Admitting: Physician Assistant

## 2017-06-11 ENCOUNTER — Other Ambulatory Visit: Payer: Self-pay

## 2017-06-11 VITALS — BP 114/81 | HR 87 | Temp 98.5°F | Ht 60.43 in | Wt 118.6 lb

## 2017-06-11 DIAGNOSIS — K2900 Acute gastritis without bleeding: Secondary | ICD-10-CM

## 2017-06-11 DIAGNOSIS — R109 Unspecified abdominal pain: Secondary | ICD-10-CM

## 2017-06-11 LAB — POCT URINE PREGNANCY: PREG TEST UR: NEGATIVE

## 2017-06-11 LAB — POCT URINALYSIS DIP (MANUAL ENTRY)
Bilirubin, UA: NEGATIVE
Blood, UA: NEGATIVE
Glucose, UA: NEGATIVE mg/dL
Ketones, POC UA: NEGATIVE mg/dL
Leukocytes, UA: NEGATIVE
Nitrite, UA: NEGATIVE
PH UA: 7.5 (ref 5.0–8.0)
PROTEIN UA: NEGATIVE mg/dL
SPEC GRAV UA: 1.02 (ref 1.010–1.025)
UROBILINOGEN UA: 0.2 U/dL

## 2017-06-11 LAB — POCT CBC
Granulocyte percent: 65.4 %G (ref 37–80)
HCT, POC: 39.2 % (ref 37.7–47.9)
Hemoglobin: 12.9 g/dL (ref 12.2–16.2)
Lymph, poc: 2.2 (ref 0.6–3.4)
MCH, POC: 26.9 pg — AB (ref 27–31.2)
MCHC: 33.1 g/dL (ref 31.8–35.4)
MCV: 81.4 fL (ref 80–97)
MID (cbc): 0.4 (ref 0–0.9)
MPV: 6.9 fL (ref 0–99.8)
PLATELET COUNT, POC: 284 10*3/uL (ref 142–424)
POC Granulocyte: 4.9 (ref 2–6.9)
POC LYMPH %: 28.9 % (ref 10–50)
POC MID %: 5.7 %M (ref 0–12)
RBC: 4.81 M/uL (ref 4.04–5.48)
RDW, POC: 12.4 %
WBC: 7.5 10*3/uL (ref 4.6–10.2)

## 2017-06-11 MED ORDER — RANITIDINE HCL 150 MG PO TABS: 150.0000 mg | ORAL_TABLET | Freq: Two times a day (BID) | ORAL | 0 refills | Status: DC

## 2017-06-11 NOTE — Progress Notes (Signed)
Christina Schneider  MRN: [id number] DOB: Dec 18, 1980  Subjective:   Christina Schneider is a 37 y.o. female who presents for evaluation of abdominal pain. Onset was 5 days ago. Symptoms have been unchanged. The pain is described as aching, and is 7/10 in intensity. Pain is  "all over"   Aggravating factors: eating hard food in epigastric region.  Alleviating factors: heating pad and gas. Associated symptoms: none. The patient denies arthralagias, belching, chills, constipation, diarrhea, dysuria, fever, frequency, flank pain, headache, hematochezia, hematuria, melena, myalgias, nausea, sweats and vomiting. Last normal bowel movement was this morning.  Eats mostly vegetables. Drinks mostly water. No alcohol. Does note she took ibuprofen and advil pretty frequently last week for overall pain. No odd food exposure. NO recent travel.  LMP 05/22/17. She is sexually active with monogamous partner. No PSH.  Review of Systems  Per HPI  There are no active problems to display for this patient.   Current Outpatient Medications on File Prior to Visit  Medication Sig Dispense Refill  . ranitidine (ZANTAC) 150 MG tablet Take 1 tablet (150 mg total) by mouth 2 (two) times daily. 60 tablet 0   No current facility-administered medications on file prior to visit.     No Known Allergies    Social History   Socioeconomic History  . Marital status: Single    Spouse name: Not on file  . Number of children: 2  . Years of education: Not on file  . Highest education level: Not on file  Occupational History  . Occupation: WAITRESS    Employer: [employer]  Social Needs  . Financial resource strain: Not on file  . Food insecurity:    Worry: Not on file    Inability: Not on file  . Transportation needs:    Medical: Not on file    Non-medical: Not on file  Tobacco Use  . Smoking status: Never Smoker  . Smokeless tobacco: Never Used  Substance and Sexual Activity  . Alcohol use: No  . Drug use: No  . Sexual  activity: Yes    Birth control/protection: None  Lifestyle  . Physical activity:    Days per week: Not on file    Minutes per session: Not on file  . Stress: Not on file  Relationships  . Social connections:    Talks on phone: Not on file    Gets together: Not on file    Attends religious service: Not on file    Active member of club or organization: Not on file    Attends meetings of clubs or organizations: Not on file    Relationship status: Not on file  . Intimate partner violence:    Fear of current or ex partner: Not on file    Emotionally abused: Not on file    Physically abused: Not on file    Forced sexual activity: Not on file  Other Topics Concern  . Not on file  Social History Narrative   Originally from [place].   Came to the [place] in 2002.   Lives with her boyfriend and her two daughters.    Objective:  BP 114/81 (BP Location: Left Arm, Patient Position: Sitting, Cuff Size: Normal)   Pulse 87   Temp 98.5 F (36.9 C) (Oral)   Ht 5' 0.43" (1.535 m)   Wt 118 lb 9.6 oz (53.8 kg)   LMP 05/22/2017   SpO2 98%   BMI 22.83 kg/m   Physical Exam  Constitutional: She is oriented  to person, place, and time. She appears well-developed and well-nourished.  Non-toxic appearance. She does not appear ill. No distress.  HENT:  Head: Normocephalic and atraumatic.  Eyes: Conjunctivae are normal.  Neck: Normal range of motion.  Pulmonary/Chest: Effort normal.  Abdominal: Soft. Normal appearance and bowel sounds are normal. She exhibits no distension. There is generalized tenderness (mild TTP). There is no rigidity, no rebound, no guarding, no CVA tenderness, no tenderness at McBurney's point and negative Murphy's sign. No hernia.  Negative Psoas and Obturator sign.   Neurological: She is alert and oriented to person, place, and time.  Skin: Skin is warm and dry.  Psychiatric: She has a normal mood and affect.  Vitals reviewed.    Results for orders placed or performed in  visit on 06/11/17 (from the past 24 hour(s))  POCT urinalysis dipstick     Status: Normal   Collection Time: 06/11/17  5:10 PM  Result Value Ref Range   Color, UA yellow yellow   Clarity, UA clear clear   Glucose, UA negative negative mg/dL   Bilirubin, UA negative negative   Ketones, POC UA negative negative mg/dL   Spec Grav, UA 1.020 1.010 - 1.025   Blood, UA negative negative   pH, UA 7.5 5.0 - 8.0   Protein Ur, POC negative negative mg/dL   Urobilinogen, UA 0.2 0.2 or 1.0 E.U./dL   Nitrite, UA Negative Negative   Leukocytes, UA Negative Negative  POCT urine pregnancy     Status: Normal   Collection Time: 06/11/17  5:11 PM  Result Value Ref Range   Preg Test, Ur Negative Negative  POCT CBC     Status: Abnormal   Collection Time: 06/11/17  5:43 PM  Result Value Ref Range   WBC 7.5 4.6 - 10.2 K/uL   Lymph, poc 2.2 0.6 - 3.4   POC LYMPH PERCENT 28.9 10 - 50 %L   MID (cbc) 0.4 0 - 0.9   POC MID % 5.7 0 - 12 %M   POC Granulocyte 4.9 2 - 6.9   Granulocyte percent 65.4 37 - 80 %G   RBC 4.81 4.04 - 5.48 M/uL   Hemoglobin 12.9 12.2 - 16.2 g/dL   HCT, POC 39.2 37.7 - 47.9 %   MCV 81.4 80 - 97 fL   MCH, POC 26.9 (A) 27 - 31.2 pg   MCHC 33.1 31.8 - 35.4 g/dL   RDW, POC 12.4 %   Platelet Count, POC 284 142 - 424 K/uL   MPV 6.9 0 - 99.8 fL   Dg Abd 1 View  Result Date: 06/11/2017 CLINICAL DATA:  Generalized abdominal pain for 5 days. EXAM: ABDOMEN - 1 VIEW COMPARISON:  None. FINDINGS: Bowel gas pattern is normal. Oblong density overlies the left mid kidney. This could represent a renal stone or ingested radiodense material in the bowel. Phleboliths in the pelvis. The bones are normal. IMPRESSION: No acute abnormality. Radiodensity overlying the left kidney could represent ingested material or a renal stone. Electronically Signed   By: Lorriane Shire M.D.   On: 06/11/2017 17:30    Assessment and Plan :  1. Acute gastritis, presence of bleeding unspecified, unspecified gastritis  type Pt is overall well appearing, NAD. Vitals stable.  She is afebrile.  UA normal.  WBC WNL.  No acute findings on KUB.  She has some mild TTP in generalized abdomen but no focal tenderness. Suspect gastritis from recent use of NSAIDs.  Recommended ranitidine and bland diet, advance diet  as tolerated.  Labs pending. - ranitidine (ZANTAC) 150 MG tablet; Take 1 tablet (150 mg total) by mouth 2 (two) times daily.  Dispense: 30 tablet; Refill: 0  2. Stomach ache - POCT urinalysis dipstick - POCT urine pregnancy - POCT CBC - CMP14+EGFR - Lipase - DG Abd 1 View; Future  Side effects, risks, benefits, and alternatives of the medications and treatment plan prescribed today were discussed, and patient expressed understanding of the instructions given. No barriers to understanding were identified. Red flags discussed in detail. Given strict ED/return precautions. Pt expressed understanding regarding what to do in case of emergency/urgent symptoms.   Tenna Delaine PA-C  [facility] at [facility] 06/11/2017 5:58 PM

## 2017-06-11 NOTE — Patient Instructions (Addendum)
Your symptoms are consistent with gastritis. Please take zantac twice daily over the next few days. I also recommend over the counter pepto bismol for upset stomach. We have collected labs today and should have those results back within a few days. If any of your symptoms worsen or you develop new concerning symptoms such as focal pain, nausea, vomiting, or fever, please seek care immediately. I also recommend eating a bland diet with banana, rice, apple, and toast. Drink plenty of fluids. Thank you for letting me participate in your health and well being.   Gastritis, Adult Gastritis is swelling (inflammation) of the stomach. When you have this condition, you can have these problems (symptoms):  Pain in your stomach.  A burning feeling in your stomach.  Feeling sick to your stomach (nauseous).  Throwing up (vomiting).  Feeling too full after you eat.  It is important to get help for this condition. Without help, your stomach can bleed, and you can get sores (ulcers) in your stomach. Follow these instructions at home:  Take over-the-counter and prescription medicines only as told by your doctor.  If you were prescribed an antibiotic medicine, take it as told by your doctor. Do not stop taking it even if you start to feel better.  Drink enough fluid to keep your pee (urine) clear or pale yellow.  Instead of eating big meals, eat small meals often. Contact a health care provider if:  Your problems get worse.  Your problems go away and then come back. Get help right away if:  You throw up blood or something that looks like coffee grounds.  You have black or dark red poop (stools).  You cannot keep fluids down.  Your stomach pain gets worse.  You have a fever.  You do not feel better after 1 week. This information is not intended to replace advice given to you by your health care provider. Make sure you discuss any questions you have with your health care provider. Document  Released: 06/14/2007 Document Revised: 08/25/2015 Document Reviewed: 09/19/2014 Elsevier Interactive Patient Education  2018 [employer]    IF you received an x-ray today, you will receive an invoice from [facility]. Please contact [facility] at [phone] with questions or concerns regarding your invoice.   IF you received labwork today, you will receive an invoice from [facility]. Please contact [facility] at [phone] with questions or concerns regarding your invoice.   Our billing staff will not be able to assist you with questions regarding bills from these companies.  You will be contacted with the lab results as soon as they are available. The fastest way to get your results is to activate your My Chart account. Instructions are located on the last page of this paperwork. If you have not heard from us regarding the results in 2 weeks, please contact this office.

## 2017-06-12 LAB — CMP14+EGFR
ALBUMIN: 4.5 g/dL (ref 3.5–5.5)
ALK PHOS: 47 IU/L (ref 39–117)
ALT: 66 IU/L — ABNORMAL HIGH (ref 0–32)
AST: 25 IU/L (ref 0–40)
Albumin/Globulin Ratio: 1.8 (ref 1.2–2.2)
BILIRUBIN TOTAL: 0.2 mg/dL (ref 0.0–1.2)
BUN / CREAT RATIO: 13 (ref 9–23)
BUN: 10 mg/dL (ref 6–20)
CHLORIDE: 101 mmol/L (ref 96–106)
CO2: 26 mmol/L (ref 20–29)
Calcium: 9.4 mg/dL (ref 8.7–10.2)
Creatinine, Ser: 0.78 mg/dL (ref 0.57–1.00)
GFR calc Af Amer: 114 mL/min/{1.73_m2} (ref >59)
GFR calc non Af Amer: 99 mL/min/{1.73_m2} (ref >59)
GLUCOSE: 70 mg/dL (ref 65–99)
Globulin, Total: 2.5 g/dL (ref 1.5–4.5)
Potassium: 4.1 mmol/L (ref 3.5–5.2)
SODIUM: 141 mmol/L (ref 134–144)
Total Protein: 7 g/dL (ref 6.0–8.5)

## 2017-06-12 LAB — LIPASE: Lipase: 20 U/L (ref 14–72)

## 2017-06-13 ENCOUNTER — Encounter: Payer: Self-pay | Admitting: Physician Assistant

## 2017-06-18 ENCOUNTER — Encounter: Payer: Self-pay | Admitting: *Deleted

## 2017-09-17 LAB — OB RESULTS CONSOLE HIV ANTIBODY (ROUTINE TESTING): HIV: NONREACTIVE

## 2017-09-17 LAB — OB RESULTS CONSOLE HEPATITIS B SURFACE ANTIGEN: Hepatitis B Surface Ag: POSITIVE

## 2017-09-17 LAB — OB RESULTS CONSOLE RPR: RPR: NONREACTIVE

## 2017-09-17 LAB — OB RESULTS CONSOLE RUBELLA ANTIBODY, IGM: Rubella: IMMUNE

## 2017-09-18 ENCOUNTER — Other Ambulatory Visit (HOSPITAL_COMMUNITY): Payer: Self-pay | Admitting: Nurse Practitioner

## 2017-09-18 DIAGNOSIS — Z369 Encounter for antenatal screening, unspecified: Secondary | ICD-10-CM

## 2017-09-18 DIAGNOSIS — Z3A13 13 weeks gestation of pregnancy: Secondary | ICD-10-CM

## 2017-09-24 ENCOUNTER — Encounter (HOSPITAL_COMMUNITY): Payer: Self-pay | Admitting: *Deleted

## 2017-09-26 ENCOUNTER — Ambulatory Visit (HOSPITAL_COMMUNITY)
Admission: RE | Admit: 2017-09-26 | Discharge: 2017-09-26 | Disposition: A | Discharge status: Home / Self Care | Payer: Medicaid Other | Source: Ambulatory Visit | Attending: Nurse Practitioner | Admitting: Nurse Practitioner

## 2017-09-26 ENCOUNTER — Encounter (HOSPITAL_COMMUNITY): Payer: Self-pay

## 2017-09-26 DIAGNOSIS — D573 Sickle-cell trait: Secondary | ICD-10-CM | POA: Insufficient documentation

## 2017-09-26 DIAGNOSIS — O09521 Supervision of elderly multigravida, first trimester: Secondary | ICD-10-CM | POA: Diagnosis not present

## 2017-09-26 DIAGNOSIS — Z3682 Encounter for antenatal screening for nuchal translucency: Secondary | ICD-10-CM | POA: Diagnosis not present

## 2017-09-26 DIAGNOSIS — O99011 Anemia complicating pregnancy, first trimester: Secondary | ICD-10-CM | POA: Diagnosis not present

## 2017-09-26 DIAGNOSIS — Z3A13 13 weeks gestation of pregnancy: Secondary | ICD-10-CM | POA: Diagnosis not present

## 2017-09-26 DIAGNOSIS — Z369 Encounter for antenatal screening, unspecified: Secondary | ICD-10-CM

## 2017-09-26 NOTE — ED Notes (Signed)
Patient declined Genetic Counseling, but agreed to [employer].

## 2017-09-27 ENCOUNTER — Ambulatory Visit (HOSPITAL_COMMUNITY): Payer: Self-pay

## 2017-10-04 ENCOUNTER — Other Ambulatory Visit (HOSPITAL_COMMUNITY): Payer: Self-pay

## 2018-03-01 LAB — OB RESULTS CONSOLE GBS: GBS: NEGATIVE

## 2018-03-01 LAB — OB RESULTS CONSOLE GC/CHLAMYDIA: Gonorrhea: NEGATIVE

## 2018-03-17 ENCOUNTER — Inpatient Hospital Stay (HOSPITAL_COMMUNITY): Payer: Medicaid Other | Admitting: Anesthesiology

## 2018-03-17 ENCOUNTER — Encounter (HOSPITAL_COMMUNITY)
Admission: AD | Disposition: A | Discharge status: Home / Self Care | Payer: Self-pay | Source: Home / Self Care | Attending: Obstetrics & Gynecology

## 2018-03-17 ENCOUNTER — Inpatient Hospital Stay (HOSPITAL_COMMUNITY)
Admission: AD | Admit: 2018-03-17 | Discharge: 2018-03-19 | DRG: 787 | Disposition: A | Discharge status: Home / Self Care | Payer: Medicaid Other | Attending: Obstetrics & Gynecology | Admitting: Obstetrics & Gynecology

## 2018-03-17 ENCOUNTER — Other Ambulatory Visit: Payer: Self-pay

## 2018-03-17 ENCOUNTER — Encounter (HOSPITAL_COMMUNITY): Payer: Self-pay

## 2018-03-17 DIAGNOSIS — O99824 Streptococcus B carrier state complicating childbirth: Secondary | ICD-10-CM | POA: Diagnosis present

## 2018-03-17 DIAGNOSIS — O328XX0 Maternal care for other malpresentation of fetus, not applicable or unspecified: Secondary | ICD-10-CM | POA: Diagnosis present

## 2018-03-17 DIAGNOSIS — Z3A38 38 weeks gestation of pregnancy: Secondary | ICD-10-CM

## 2018-03-17 DIAGNOSIS — O321XX0 Maternal care for breech presentation, not applicable or unspecified: Secondary | ICD-10-CM | POA: Diagnosis not present

## 2018-03-17 DIAGNOSIS — O9842 Viral hepatitis complicating childbirth: Secondary | ICD-10-CM | POA: Diagnosis present

## 2018-03-17 DIAGNOSIS — B1910 Unspecified viral hepatitis B without hepatic coma: Secondary | ICD-10-CM | POA: Diagnosis present

## 2018-03-17 DIAGNOSIS — B181 Chronic viral hepatitis B without delta-agent: Secondary | ICD-10-CM | POA: Diagnosis present

## 2018-03-17 DIAGNOSIS — O98419 Viral hepatitis complicating pregnancy, unspecified trimester: Secondary | ICD-10-CM

## 2018-03-17 LAB — TYPE AND SCREEN
ABO/RH(D): A POS
Antibody Screen: NEGATIVE

## 2018-03-17 LAB — CBC
HEMATOCRIT: 38.7 % (ref 36.0–46.0)
Hemoglobin: 12.7 g/dL (ref 12.0–15.0)
MCH: 26.2 pg (ref 26.0–34.0)
MCHC: 32.8 g/dL (ref 30.0–36.0)
MCV: 80 fL (ref 80.0–100.0)
Platelets: 217 10*3/uL (ref 150–400)
RBC: 4.84 MIL/uL (ref 3.87–5.11)
RDW: 14.5 % (ref 11.5–15.5)
WBC: 11.4 10*3/uL — ABNORMAL HIGH (ref 4.0–10.5)
nRBC: 0 % (ref 0.0–0.2)

## 2018-03-17 LAB — ABO/RH: ABO/RH(D): A POS

## 2018-03-17 SURGERY — Surgical Case
Anesthesia: Spinal

## 2018-03-17 MED ORDER — FENTANYL CITRATE (PF) 100 MCG/2ML IJ SOLN
INTRAMUSCULAR | Status: DC | PRN
  Administered 2018-03-17: 15 ug via INTRATHECAL

## 2018-03-17 MED ORDER — OXYTOCIN 40 UNITS IN NORMAL SALINE INFUSION - SIMPLE MED: 2.5000 [IU]/h | INTRAVENOUS | Status: AC

## 2018-03-17 MED ORDER — ONDANSETRON HCL 4 MG/2ML IJ SOLN: 4.0000 mg | Freq: Four times a day (QID) | INTRAMUSCULAR | Status: DC | PRN

## 2018-03-17 MED ORDER — BUPIVACAINE HCL (PF) 0.5 % IJ SOLN
INTRAMUSCULAR | Status: AC
  Filled 2018-03-17: qty 30

## 2018-03-17 MED ORDER — MORPHINE SULFATE (PF) 0.5 MG/ML IJ SOLN
INTRAMUSCULAR | Status: AC
  Filled 2018-03-17: qty 10

## 2018-03-17 MED ORDER — OXYTOCIN 40 UNITS IN NORMAL SALINE INFUSION - SIMPLE MED: 2.5000 [IU]/h | INTRAVENOUS | Status: DC

## 2018-03-17 MED ORDER — KETOROLAC TROMETHAMINE 30 MG/ML IJ SOLN
30.0000 mg | Freq: Four times a day (QID) | INTRAMUSCULAR | Status: AC
  Administered 2018-03-17 – 2018-03-18 (×4): 30 mg via INTRAVENOUS
  Filled 2018-03-17 (×4): qty 1

## 2018-03-17 MED ORDER — TETANUS-DIPHTH-ACELL PERTUSSIS 5-2.5-18.5 LF-MCG/0.5 IM SUSP: 0.5000 mL | Freq: Once | INTRAMUSCULAR | Status: DC

## 2018-03-17 MED ORDER — LIDOCAINE HCL (PF) 1 % IJ SOLN: 30.0000 mL | INTRAMUSCULAR | Status: DC | PRN

## 2018-03-17 MED ORDER — SOD CITRATE-CITRIC ACID 500-334 MG/5ML PO SOLN: 30.0000 mL | ORAL | Status: DC

## 2018-03-17 MED ORDER — SIMETHICONE 80 MG PO CHEW
80.0000 mg | CHEWABLE_TABLET | ORAL | Status: DC
  Administered 2018-03-17 – 2018-03-18 (×2): 80 mg via ORAL
  Filled 2018-03-17 (×2): qty 1

## 2018-03-17 MED ORDER — OXYTOCIN BOLUS FROM INFUSION: 500.0000 mL | Freq: Once | INTRAVENOUS | Status: DC

## 2018-03-17 MED ORDER — OXYTOCIN 10 UNIT/ML IJ SOLN
INTRAVENOUS | Status: DC | PRN
  Administered 2018-03-17: 40 [IU] via INTRAVENOUS

## 2018-03-17 MED ORDER — ONDANSETRON HCL 4 MG/2ML IJ SOLN
INTRAMUSCULAR | Status: DC | PRN
  Administered 2018-03-17: 4 mg via INTRAVENOUS

## 2018-03-17 MED ORDER — OXYCODONE-ACETAMINOPHEN 5-325 MG PO TABS: 1.0000 | ORAL_TABLET | ORAL | Status: DC | PRN

## 2018-03-17 MED ORDER — OXYCODONE-ACETAMINOPHEN 5-325 MG PO TABS: 2.0000 | ORAL_TABLET | ORAL | Status: DC | PRN

## 2018-03-17 MED ORDER — SOD CITRATE-CITRIC ACID 500-334 MG/5ML PO SOLN
ORAL | Status: AC
  Administered 2018-03-17: 30 mL
  Filled 2018-03-17: qty 15

## 2018-03-17 MED ORDER — METOCLOPRAMIDE HCL 5 MG/ML IJ SOLN
INTRAMUSCULAR | Status: DC | PRN
  Administered 2018-03-17: 10 mg via INTRAVENOUS

## 2018-03-17 MED ORDER — DEXAMETHASONE SODIUM PHOSPHATE 4 MG/ML IJ SOLN
INTRAMUSCULAR | Status: DC | PRN
  Administered 2018-03-17: 4 mg via INTRAVENOUS

## 2018-03-17 MED ORDER — METOCLOPRAMIDE HCL 5 MG/ML IJ SOLN
INTRAMUSCULAR | Status: AC
  Filled 2018-03-17: qty 2

## 2018-03-17 MED ORDER — MEPERIDINE HCL 25 MG/ML IJ SOLN: 6.2500 mg | INTRAMUSCULAR | Status: DC | PRN

## 2018-03-17 MED ORDER — SCOPOLAMINE 1 MG/3DAYS TD PT72
MEDICATED_PATCH | TRANSDERMAL | Status: AC
  Filled 2018-03-17: qty 1

## 2018-03-17 MED ORDER — BUPIVACAINE IN DEXTROSE 0.75-8.25 % IT SOLN
INTRATHECAL | Status: DC | PRN
  Administered 2018-03-17: 1.3 mL via INTRATHECAL

## 2018-03-17 MED ORDER — ENOXAPARIN SODIUM 40 MG/0.4ML ~~LOC~~ SOLN
40.0000 mg | SUBCUTANEOUS | Status: DC
  Administered 2018-03-18: 40 mg via SUBCUTANEOUS
  Filled 2018-03-17: qty 0.4

## 2018-03-17 MED ORDER — COCONUT OIL OIL: 1.0000 "application " | TOPICAL_OIL | Status: DC | PRN

## 2018-03-17 MED ORDER — OXYCODONE HCL 5 MG PO TABS: 5.0000 mg | ORAL_TABLET | Freq: Once | ORAL | Status: DC | PRN

## 2018-03-17 MED ORDER — ONDANSETRON HCL 4 MG/2ML IJ SOLN
INTRAMUSCULAR | Status: AC
  Filled 2018-03-17: qty 2

## 2018-03-17 MED ORDER — ACETAMINOPHEN 500 MG PO TABS: 1000.0000 mg | ORAL_TABLET | ORAL | Status: DC

## 2018-03-17 MED ORDER — WITCH HAZEL-GLYCERIN EX PADS: 1.0000 "application " | MEDICATED_PAD | CUTANEOUS | Status: DC | PRN

## 2018-03-17 MED ORDER — CEFAZOLIN SODIUM-DEXTROSE 2-4 GM/100ML-% IV SOLN
2.0000 g | INTRAVENOUS | Status: AC
  Administered 2018-03-17: 2 g via INTRAVENOUS

## 2018-03-17 MED ORDER — GABAPENTIN 100 MG PO CAPS
100.0000 mg | ORAL_CAPSULE | Freq: Two times a day (BID) | ORAL | Status: DC
  Administered 2018-03-17 – 2018-03-19 (×4): 100 mg via ORAL
  Filled 2018-03-17 (×4): qty 1

## 2018-03-17 MED ORDER — LACTATED RINGERS IV SOLN
INTRAVENOUS | Status: DC
  Administered 2018-03-17 (×3): via INTRAVENOUS

## 2018-03-17 MED ORDER — BUPIVACAINE HCL (PF) 0.5 % IJ SOLN
INTRAMUSCULAR | Status: DC | PRN
  Administered 2018-03-17: 30 mL

## 2018-03-17 MED ORDER — LACTATED RINGERS IV SOLN
INTRAVENOUS | Status: DC | PRN
  Administered 2018-03-17: 18:00:00 via INTRAVENOUS

## 2018-03-17 MED ORDER — LACTATED RINGERS IV SOLN: 125.0000 mL/h | INTRAVENOUS | Status: DC

## 2018-03-17 MED ORDER — SENNOSIDES-DOCUSATE SODIUM 8.6-50 MG PO TABS
2.0000 | ORAL_TABLET | ORAL | Status: DC
  Administered 2018-03-17 – 2018-03-18 (×2): 2 via ORAL
  Filled 2018-03-17 (×2): qty 2

## 2018-03-17 MED ORDER — TERBUTALINE SULFATE 1 MG/ML IJ SOLN
0.2500 mg | Freq: Once | INTRAMUSCULAR | Status: AC
  Administered 2018-03-17: 0.25 mg via SUBCUTANEOUS
  Filled 2018-03-17: qty 1

## 2018-03-17 MED ORDER — SOD CITRATE-CITRIC ACID 500-334 MG/5ML PO SOLN: 30.0000 mL | ORAL | Status: DC | PRN

## 2018-03-17 MED ORDER — ZOLPIDEM TARTRATE 5 MG PO TABS: 5.0000 mg | ORAL_TABLET | Freq: Every evening | ORAL | Status: DC | PRN

## 2018-03-17 MED ORDER — PHENYLEPHRINE 40 MCG/ML (10ML) SYRINGE FOR IV PUSH (FOR BLOOD PRESSURE SUPPORT)
PREFILLED_SYRINGE | INTRAVENOUS | Status: AC
  Filled 2018-03-17: qty 10

## 2018-03-17 MED ORDER — IBUPROFEN 800 MG PO TABS
800.0000 mg | ORAL_TABLET | Freq: Four times a day (QID) | ORAL | Status: DC
  Administered 2018-03-18 – 2018-03-19 (×2): 800 mg via ORAL
  Filled 2018-03-17 (×2): qty 1

## 2018-03-17 MED ORDER — ACETAMINOPHEN 325 MG PO TABS: 650.0000 mg | ORAL_TABLET | ORAL | Status: DC | PRN

## 2018-03-17 MED ORDER — DEXAMETHASONE SODIUM PHOSPHATE 10 MG/ML IJ SOLN
INTRAMUSCULAR | Status: AC
  Filled 2018-03-17: qty 1

## 2018-03-17 MED ORDER — OXYCODONE HCL 5 MG/5ML PO SOLN: 5.0000 mg | Freq: Once | ORAL | Status: DC | PRN

## 2018-03-17 MED ORDER — LACTATED RINGERS IV SOLN: 500.0000 mL | INTRAVENOUS | Status: DC | PRN

## 2018-03-17 MED ORDER — DIPHENHYDRAMINE HCL 25 MG PO CAPS: 25.0000 mg | ORAL_CAPSULE | Freq: Four times a day (QID) | ORAL | Status: DC | PRN

## 2018-03-17 MED ORDER — PHENYLEPHRINE HCL-NACL 20-0.9 MG/250ML-% IV SOLN
INTRAVENOUS | Status: DC | PRN
  Administered 2018-03-17: 60 ug/min via INTRAVENOUS

## 2018-03-17 MED ORDER — SIMETHICONE 80 MG PO CHEW
80.0000 mg | CHEWABLE_TABLET | Freq: Three times a day (TID) | ORAL | Status: DC
  Administered 2018-03-18 – 2018-03-19 (×3): 80 mg via ORAL
  Filled 2018-03-17 (×3): qty 1

## 2018-03-17 MED ORDER — OXYTOCIN 40 UNITS IN NORMAL SALINE INFUSION - SIMPLE MED
INTRAVENOUS | Status: AC
  Filled 2018-03-17: qty 1000

## 2018-03-17 MED ORDER — ACETAMINOPHEN 160 MG/5ML PO SOLN: 325.0000 mg | ORAL | Status: DC | PRN

## 2018-03-17 MED ORDER — MORPHINE SULFATE (PF) 0.5 MG/ML IJ SOLN
INTRAMUSCULAR | Status: DC | PRN
  Administered 2018-03-17: .15 ug via INTRATHECAL

## 2018-03-17 MED ORDER — SIMETHICONE 80 MG PO CHEW: 80.0000 mg | CHEWABLE_TABLET | ORAL | Status: DC | PRN

## 2018-03-17 MED ORDER — MEASLES, MUMPS & RUBELLA VAC IJ SOLR: 0.5000 mL | Freq: Once | INTRAMUSCULAR | Status: DC

## 2018-03-17 MED ORDER — PRENATAL MULTIVITAMIN CH
1.0000 | ORAL_TABLET | Freq: Every day | ORAL | Status: DC
  Administered 2018-03-18: 1 via ORAL

## 2018-03-17 MED ORDER — ACETAMINOPHEN 325 MG PO TABS: 325.0000 mg | ORAL_TABLET | ORAL | Status: DC | PRN

## 2018-03-17 MED ORDER — PHENYLEPHRINE HCL 10 MG/ML IJ SOLN
INTRAMUSCULAR | Status: DC | PRN
  Administered 2018-03-17: 160 ug via INTRAVENOUS
  Administered 2018-03-17: 80 ug via INTRAVENOUS
  Administered 2018-03-17: 120 ug via INTRAVENOUS
  Administered 2018-03-17: 40 ug via INTRAVENOUS

## 2018-03-17 MED ORDER — PHENYLEPHRINE HCL-NACL 20-0.9 MG/250ML-% IV SOLN
INTRAVENOUS | Status: AC
  Filled 2018-03-17: qty 250

## 2018-03-17 MED ORDER — FENTANYL CITRATE (PF) 100 MCG/2ML IJ SOLN
INTRAMUSCULAR | Status: AC
  Filled 2018-03-17: qty 2

## 2018-03-17 MED ORDER — MENTHOL 3 MG MT LOZG: 1.0000 | LOZENGE | OROMUCOSAL | Status: DC | PRN

## 2018-03-17 MED ORDER — FENTANYL CITRATE (PF) 100 MCG/2ML IJ SOLN: 25.0000 ug | INTRAMUSCULAR | Status: DC | PRN

## 2018-03-17 MED ORDER — DIBUCAINE 1 % RE OINT: 1.0000 "application " | TOPICAL_OINTMENT | RECTAL | Status: DC | PRN

## 2018-03-17 MED ORDER — ONDANSETRON HCL 4 MG/2ML IJ SOLN: 4.0000 mg | Freq: Once | INTRAMUSCULAR | Status: DC | PRN

## 2018-03-17 MED ORDER — GABAPENTIN 300 MG PO CAPS
300.0000 mg | ORAL_CAPSULE | ORAL | Status: DC
  Filled 2018-03-17: qty 1

## 2018-03-17 MED ORDER — LACTATED RINGERS IV SOLN
INTRAVENOUS | Status: DC
  Administered 2018-03-17: via INTRAVENOUS

## 2018-03-17 SURGICAL SUPPLY — 37 items
APL SKNCLS STERI-STRIP NONHPOA (GAUZE/BANDAGES/DRESSINGS) ×1
BARRIER ADHS 3X4 INTERCEED (GAUZE/BANDAGES/DRESSINGS) IMPLANT
BENZOIN TINCTURE PRP APPL 2/3 (GAUZE/BANDAGES/DRESSINGS) ×2 IMPLANT
BRR ADH 4X3 ABS CNTRL BYND (GAUZE/BANDAGES/DRESSINGS)
CHLORAPREP W/TINT 26ML (MISCELLANEOUS) ×3 IMPLANT
CLAMP CORD UMBIL (MISCELLANEOUS) IMPLANT
CLOSURE STERI STRIP 1/2 X4 (GAUZE/BANDAGES/DRESSINGS) ×2 IMPLANT
CLOTH BEACON ORANGE TIMEOUT ST (SAFETY) ×3 IMPLANT
DECANTER SPIKE VIAL GLASS SM (MISCELLANEOUS) ×2 IMPLANT
DRSG OPSITE POSTOP 4X10 (GAUZE/BANDAGES/DRESSINGS) ×3 IMPLANT
ELECT REM PT RETURN 9FT ADLT (ELECTROSURGICAL) ×3
ELECTRODE REM PT RTRN 9FT ADLT (ELECTROSURGICAL) ×1 IMPLANT
EXTRACTOR VACUUM KIWI (MISCELLANEOUS) IMPLANT
GLOVE BIO SURGEON STRL SZ 6.5 (GLOVE) ×2 IMPLANT
GLOVE BIO SURGEONS STRL SZ 6.5 (GLOVE) ×1
GLOVE BIOGEL PI IND STRL 7.0 (GLOVE) ×2 IMPLANT
GLOVE BIOGEL PI INDICATOR 7.0 (GLOVE) ×4
GOWN STRL REUS W/TWL LRG LVL3 (GOWN DISPOSABLE) ×6 IMPLANT
KIT ABG SYR 3ML LUER SLIP (SYRINGE) IMPLANT
NDL HYPO 25X5/8 SAFETYGLIDE (NEEDLE) IMPLANT
NEEDLE HYPO 22GX1.5 SAFETY (NEEDLE) IMPLANT
NEEDLE HYPO 25X5/8 SAFETYGLIDE (NEEDLE) IMPLANT
NS IRRIG 1000ML POUR BTL (IV SOLUTION) ×3 IMPLANT
PACK C SECTION WH (CUSTOM PROCEDURE TRAY) ×3 IMPLANT
PAD OB MATERNITY 4.3X12.25 (PERSONAL CARE ITEMS) ×3 IMPLANT
PENCIL SMOKE EVAC W/HOLSTER (ELECTROSURGICAL) ×3 IMPLANT
RETRACTOR WND ALEXIS 25 LRG (MISCELLANEOUS) IMPLANT
RTRCTR WOUND ALEXIS 25CM LRG (MISCELLANEOUS)
SUT VIC AB 0 CT1 36 (SUTURE) ×18 IMPLANT
SUT VIC AB 2-0 CT1 27 (SUTURE) ×3
SUT VIC AB 2-0 CT1 TAPERPNT 27 (SUTURE) ×1 IMPLANT
SUT VIC AB 4-0 KS 27 (SUTURE) ×2 IMPLANT
SUT VIC AB 4-0 PS2 27 (SUTURE) ×3 IMPLANT
SYR CONTROL 10ML LL (SYRINGE) IMPLANT
TOWEL OR 17X24 6PK STRL BLUE (TOWEL DISPOSABLE) ×3 IMPLANT
TRAY FOLEY W/BAG SLVR 14FR LF (SET/KITS/TRAYS/PACK) IMPLANT
WATER STERILE IRR 1000ML POUR (IV SOLUTION) ×3 IMPLANT

## 2018-03-17 NOTE — H&P (Signed)
Christina Schneider is a 38 y.o. female presenting for contractions with breech presentation by [place] [facility] the [facility] 2 days ago.[redacted]w[redacted]d G3P2002  OB History    Gravida  3   Para  2   Term  2   Preterm      AB      Living  2     SAB      TAB      Ectopic      Multiple      Live Births  1          Past Medical History:  Diagnosis Date  . Hepatitis B carrier [facility])    Past Surgical History:  Procedure Laterality Date  . CYSTECTOMY    . DENTAL SURGERY     Family History: family history is not on file. Social History:  reports that she has never smoked. She has never used smokeless tobacco. She reports that she does not drink alcohol or use drugs.     Maternal Diabetes: No Genetic Screening: Normal Maternal Ultrasounds/Referrals: Normal Fetal Ultrasounds or other Referrals:  None Maternal Substance Abuse:  No Significant Maternal Medications:  None Significant Maternal Lab Results:  None Other Comments:  breech at admission  ROS Maternal Medical History:  Reason for admission: Contractions.   Contractions: Onset was 3-5 hours ago.   Frequency: irregular.    Fetal activity: Perceived fetal activity is normal.   Last perceived fetal movement was within the past hour.    Prenatal complications: no prenatal complications Prenatal Complications - Diabetes: none.    Dilation: 5 Effacement (%): 90 Exam by:: n druebbisch rn Blood pressure 121/79, pulse 89, temperature 98.1 F (36.7 C), temperature source Oral, resp. rate 18, last menstrual period 06/22/2017, SpO2 99 %. Maternal Exam:  Uterine Assessment: Contraction strength is mild.  Contraction frequency is irregular.   Abdomen: Patient reports no abdominal tenderness. Fetal presentation: breech  Introitus: not evaluated.   Pelvis: adequate for delivery.      Physical Exam  Vitals reviewed. Constitutional: She is oriented to person, place, and time. She appears well-developed. No distress.   Cardiovascular: Normal rate.  Respiratory: Effort normal.  GI:  Gravid, [place] breech, nl AF  Neurological: She is alert and oriented to person, place, and time.  Skin: Skin is warm and dry.  Psychiatric: She has a normal mood and affect. Her behavior is normal.    Prenatal labs: ABO, Rh:  A pos Antibody:  neg Rubella:  pos RPR:   neg HBsAg:   neg HIV:   neg GBS:   positvie  Assessment/Plan: [redacted]w[redacted]d [id number] Breech presentation and she requests attempt at ECV. The risk of failure, membrane rupture, emergency cesarean section, pain, fetal intolerance were discussed and questions were answered   Scheryl Darter 03/17/2018, 4:15 PM

## 2018-03-17 NOTE — Anesthesia Preprocedure Evaluation (Signed)
Anesthesia Evaluation  Patient identified by MRN, date of birth, ID band Patient awake    Reviewed: Allergy & Precautions, H&P , NPO status , Patient's Chart, lab work & pertinent test results, reviewed documented beta blocker date and time   Airway Mallampati: II  TM Distance: >3 FB Neck ROM: full    Dental no notable dental hx.    Pulmonary neg pulmonary ROS,    Pulmonary exam normal breath sounds clear to auscultation       Cardiovascular negative cardio ROS Normal cardiovascular exam Rhythm:regular Rate:Normal     Neuro/Psych negative neurological ROS  negative psych ROS   GI/Hepatic negative GI ROS, (+) Hepatitis -, B  Endo/Other  negative endocrine ROS  Renal/GU negative Renal ROS  negative genitourinary   Musculoskeletal   Abdominal   Peds  Hematology negative hematology ROS (+)   Anesthesia Other Findings   Reproductive/Obstetrics (+) Pregnancy                             Anesthesia Physical Anesthesia Plan  ASA: II and emergent  Anesthesia Plan: Spinal   Post-op Pain Management:    Induction:   PONV Risk Score and Plan: 2 and Treatment may vary due to age or medical condition  Airway Management Planned: Nasal Cannula  Additional Equipment:   Intra-op Plan:   Post-operative Plan:   Informed Consent: I have reviewed the patients History and Physical, chart, labs and discussed the procedure including the risks, benefits and alternatives for the proposed anesthesia with the patient or authorized representative who has indicated his/her understanding and acceptance.       Plan Discussed with: CRNA, Anesthesiologist and Surgeon  Anesthesia Plan Comments: (  )        Anesthesia Quick Evaluation

## 2018-03-17 NOTE — Op Note (Signed)
Cesarean Section Operative Report  PATIENT: Christina Schneider  PROCEDURE DATE: 03/17/2018  PREOPERATIVE DIAGNOSES: Intrauterine pregnancy at [redacted]w[redacted]d weeks gestation; malpresentation: breech, s/p failed ECV   POSTOPERATIVE DIAGNOSES: The same  PROCEDURE: Primary Low Transverse Cesarean Section  SURGEON:   Surgeon(s) and Role:    * Adam Phenix, MD - Primary    * Arvilla Market, DO - Fellow    INDICATIONS: Christina Schneider is a 38 y.o. G3P2002 at [redacted]w[redacted]d here for cesarean section secondary to the indications listed under preoperative diagnoses; please see preoperative note for further details.  The risks of cesarean section were discussed with the patient including but were not limited to: bleeding which may require transfusion or reoperation; infection which may require antibiotics; injury to bowel, bladder, ureters or other surrounding organs; injury to the fetus; need for additional procedures including hysterectomy in the event of a life-threatening hemorrhage; placental abnormalities wth subsequent pregnancies, incisional problems, thromboembolic phenomenon and other postoperative/anesthesia complications.   The patient concurred with the proposed plan, giving informed written consent for the procedure.    FINDINGS:  Viable female infant in footling breech presentation.  Apgars 8 and 9.  Clear amniotic fluid.  Intact placenta, three vessel cord.  Normal uterus, fallopian tubes and ovaries bilaterally.  ANESTHESIA: Spinal INTRAVENOUS FLUIDS: 400 mL  ESTIMATED BLOOD LOSS: 628 mL URINE OUTPUT:  100 ml SPECIMENS: Placenta sent to L&D COMPLICATIONS: None immediate  PROCEDURE IN DETAIL:  The patient preoperatively received intravenous antibiotics and had sequential compression devices applied to her lower extremities.  She was then taken to the operating room where spinal anesthesia was administered and was found to be adequate. She was then placed in a dorsal supine position with a leftward  tilt, and prepped and draped in a sterile manner.  A foley catheter was placed into her bladder and attached to constant gravity.    After an adequate timeout was performed, a Pfannenstiel skin incision was made with scalpel and carried through to the underlying layer of fascia. The fascia was incised in the midline, and this incision was extended bilaterally using the Mayo scissors.  Kocher clamps were applied to the superior aspect of the fascial incision and the underlying rectus muscles were dissected off bluntly.  A similar process was carried out on the inferior aspect of the fascial incision. The rectus muscles were separated in the midline bluntly and the peritoneum was entered bluntly. Attention was turned to the lower uterine segment where a low transverse hysterotomy was made with a scalpel and extended bilaterally bluntly.  The infant was successfully delivered, the cord was clamped and cut after one minute, and the infant was handed over to the awaiting neonatology team. Uterine massage was then administered, and the placenta delivered intact with a three-vessel cord. The uterus was then cleared of clots and debris.  The hysterotomy was closed with 0 Vicryl in a running locked fashion, and an imbricating layer was also placed with 0 Vicryl.  Figure-of-eight 0 Vicryl serosal stitches were placed to help with hemostasis.  The pelvis was cleared of all clot and debris. Hemostasis was confirmed on all surfaces.  The peritoneum was closed with a 0 Vicryl running stitch. The fascia was then closed using 0 Vicryl in a running fashion.  The subcutaneous layer was irrigated,  and 30 ml of 0.5% Marcaine was injected subcutaneously around the incision.  The skin was closed with a 4-0 Vicryl subcuticular stitch.   The patient tolerated the procedure well. Sponge, lap,  instrument and needle counts were correct x 3.  She was taken to the recovery room in stable condition.   An experienced assistant was required  given the standard of surgical care given the complexity of the case.  This assistant was needed for exposure, dissection, suctioning, retraction, instrument exchange, assisting with delivery with administration of fundal pressure, and for overall help during the procedure.   Maternal Disposition: PACU - hemodynamically stable.   Infant Disposition: stable   Marcy Siren, D.O. OB Fellow  03/17/2018, 7:09 PM

## 2018-03-17 NOTE — Progress Notes (Signed)
Essa Herrod is 38 y.o. G17P2002 female at [redacted]w[redacted]d who presented to [facility] laboring with breech presentation. Terbutaline was given. ECV was attempted by Dr. Debroah Loop with 5 total attempts. ECV unsuccessful. FHR remained stable throughout the procedure.   Cervix re-examined and found to be changing from prior exam.   Dilation: 6 Effacement (%): 80 Cervical Position: Anterior Station: Ballotable Presentation: Complete Breech Exam by:: Dr Lavera Guise: 135 bpm, moderate variability, +acels, no decels  Toco: irregular contractions after terbutaline with irritability   Discussed recommended delivery by Cesarean section given failed ECV and active labor. The risks of cesarean section discussed with the patient included but were not limited to: bleeding which may require transfusion or reoperation; infection which may require antibiotics; injury to bowel, bladder, ureters or other surrounding organs; injury to the fetus; need for additional procedures including hysterectomy in the event of a life-threatening hemorrhage; placental abnormalities wth subsequent pregnancies, incisional problems, thromboembolic phenomenon and other postoperative/anesthesia complications. The patient concurred with the proposed plan, giving informed written consent for the procedure. Patient has been NPO since 1400, she will remain NPO for procedure. Anesthesia and OR aware. Preoperative prophylactic antibiotics and SCDs ordered on call to the OR. To OR when ready.   Marcy Siren, D.O. [facility] Fellow, [facility] for [facility], [facility] 03/17/2018, 5:12 PM

## 2018-03-17 NOTE — Anesthesia Procedure Notes (Signed)
Spinal  Patient location during procedure: OR Start time: 03/17/2018 5:56 PM End time: 03/17/2018 5:59 PM Staffing Anesthesiologist: Bethena Midget, MD Preanesthetic Checklist Completed: patient identified, site marked, surgical consent, pre-op evaluation, timeout performed, IV checked, risks and benefits discussed and monitors and equipment checked Spinal Block Patient position: sitting Prep: DuraPrep Patient monitoring: heart rate, cardiac monitor, continuous pulse ox and blood pressure Approach: midline Location: L3-4 Injection technique: single-shot Needle Needle type: Sprotte  Needle gauge: 24 G Needle length: 9 cm Assessment Sensory level: T4

## 2018-03-17 NOTE — MAU Note (Signed)
Christina Schneider is a 38 y.o. at [redacted]w[redacted]d here in MAU reporting: started having ctx yesterday and today they are closer, every 5-10 min. States she is not having any bleeding or LOF, just normal discharge. ++ FM. States at her last appointment at [facility] they told her that the baby is breech, the patient states she would like to turn the baby and have a vaginal delivery. Reports no other complications with pregnancy  Onset of complaint: yesterday  Pain score: 7/10  Vitals:   03/17/18 1541  BP: 121/79  Pulse: 89  Resp: 18  Temp: 98.1 F (36.7 C)  SpO2: 99%      Lab orders placed from triage: mau labor standing orders

## 2018-03-17 NOTE — Transfer of Care (Signed)
Immediate Anesthesia Transfer of Care Note  Patient: Christina Schneider  Procedure(s) Performed: CESAREAN SECTION (N/A )  Patient Location: PACU  Anesthesia Type:Spinal  Level of Consciousness: awake, alert  and oriented  Airway & Oxygen Therapy: Patient Spontanous Breathing  Post-op Assessment: Report given to RN and Post -op Vital signs reviewed and stable  Post vital signs: Reviewed and stable  Last Vitals:  Vitals Value Taken Time  BP 78/55 03/17/2018  7:15 PM  Temp    Pulse 89 03/17/2018  7:16 PM  Resp 21 03/17/2018  7:16 PM  SpO2 100 % 03/17/2018  7:16 PM  Vitals shown include unvalidated device data.  Last Pain:  Vitals:   03/17/18 1541  TempSrc: Oral  PainSc:          Complications: No apparent anesthesia complications

## 2018-03-18 ENCOUNTER — Encounter (HOSPITAL_COMMUNITY): Payer: Self-pay | Admitting: Obstetrics & Gynecology

## 2018-03-18 DIAGNOSIS — B1910 Unspecified viral hepatitis B without hepatic coma: Secondary | ICD-10-CM | POA: Diagnosis present

## 2018-03-18 DIAGNOSIS — O98419 Viral hepatitis complicating pregnancy, unspecified trimester: Secondary | ICD-10-CM

## 2018-03-18 LAB — CBC
HEMATOCRIT: 31.4 % — AB (ref 36.0–46.0)
Hemoglobin: 10.3 g/dL — ABNORMAL LOW (ref 12.0–15.0)
MCH: 25.9 pg — ABNORMAL LOW (ref 26.0–34.0)
MCHC: 32.8 g/dL (ref 30.0–36.0)
MCV: 79.1 fL — ABNORMAL LOW (ref 80.0–100.0)
Platelets: 182 10*3/uL (ref 150–400)
RBC: 3.97 MIL/uL (ref 3.87–5.11)
RDW: 14.1 % (ref 11.5–15.5)
WBC: 15 10*3/uL — ABNORMAL HIGH (ref 4.0–10.5)
nRBC: 0 % (ref 0.0–0.2)

## 2018-03-18 LAB — CREATININE, SERUM
Creatinine, Ser: 0.58 mg/dL (ref 0.44–1.00)
GFR calc Af Amer: >60 mL/min (ref >60)
GFR calc non Af Amer: >60 mL/min (ref >60)

## 2018-03-18 NOTE — Discharge Summary (Signed)
Postpartum Discharge Summary     Patient Name: Christina Schneider DOB: September 01, 1980 MRN: [id number]  Date of admission: 03/17/2018 Delivering Provider: Adam Phenix   Date of discharge: 03/19/2018  Admitting diagnosis: CTX Intrauterine pregnancy: [redacted]w[redacted]d     Secondary diagnosis:  Active Problems:   Breech presentation   Hep B complicating pregnancy   Cesarean delivery delivered  Additional problems:      Discharge diagnosis: Term Pregnancy Delivered                                                                                                Post partum procedures:  Augmentation: n/a  Complications: None  Hospital course:  Onset of Labor With Unplanned C/S  38 y.o. yo G3P2002 at [redacted]w[redacted]d was admitted in Active Labor on 03/17/2018. Patient had a labor course significant for breech presentation. Membrane Rupture Time/Date: 6:20 PM ,03/17/2018   The patient went for cesarean section due to Malpresentation, and delivered a Viable infant,03/17/2018  Details of operation can be found in separate operative note. Patient had an uncomplicated postpartum course.  She is ambulating,tolerating a regular diet, passing flatus, and urinating well.  Patient is discharged home in stable condition 03/19/18.  Magnesium Sulfate recieved: No BMZ received: No  Physical exam  Vitals:   03/18/18 1200 03/18/18 1510 03/18/18 2202 03/19/18 0620  BP: 133/79 113/74 (!) 106/57 98/66  Pulse: 69 75 74 64  Resp: 17 16 16 18   Temp: 97.8 F (36.6 C) 98.2 F (36.8 C) 97.8 F (36.6 C) (!) 97.1 F (36.2 C)  TempSrc:  Oral Oral Oral  SpO2:  99%    Weight:       General: alert, cooperative and no distress Lochia: appropriate Uterine Fundus: firm Incision: Healing well with no significant drainage, No significant erythema, Dressing is clean, dry, and intact DVT Evaluation: No evidence of DVT seen on physical exam. Negative Homan's sign. No cords or calf tenderness. Labs: Lab Results  Component Value Date   WBC 15.0  (H) 03/18/2018   HGB 10.3 (L) 03/18/2018   HCT 31.4 (L) 03/18/2018   MCV 79.1 (L) 03/18/2018   PLT 182 03/18/2018   CMP Latest Ref Rng & Units 03/18/2018  Glucose 65 - 99 mg/dL -  BUN 6 - 20 mg/dL -  Creatinine 3.66 - 2.94 mg/dL 7.65  Sodium 465 - 035 mmol/L -  Potassium 3.5 - 5.2 mmol/L -  Chloride 96 - 106 mmol/L -  CO2 20 - 29 mmol/L -  Calcium 8.7 - 10.2 mg/dL -  Total Protein 6.0 - 8.5 g/dL -  Total Bilirubin 0.0 - 1.2 mg/dL -  Alkaline Phos 39 - 465 IU/L -  AST 0 - 40 IU/L -  ALT 0 - 32 IU/L -    Discharge instruction: per After Visit Summary and "Baby and Me Booklet".  After visit meds:  Allergies as of 03/19/2018   No Known Allergies     Medication List    STOP taking these medications   PRENATAL VITAMIN PO   ranitidine 150 MG tablet Commonly known as:  ZANTAC  TAKE these medications   ibuprofen 800 MG tablet Commonly known as:  ADVIL,MOTRIN Take 1 tablet (800 mg total) by mouth every 6 (six) hours.   oxyCODONE-acetaminophen 5-325 MG tablet Commonly known as:  PERCOCET/ROXICET Take 1-2 tablets by mouth every 4 (four) hours as needed for moderate pain.       Diet: routine diet  Activity: Advance as tolerated. Pelvic rest for 6 weeks.   Outpatient follow up:6 weeks Follow up Appt:No future appointments. Follow up Visit: Health Department managing Hep B per pt       Newborn Data: Live born female  Birth Weight: 7 lb 8.3 oz (3410 g) APGAR: 8, 9  Newborn Delivery   Birth date/time:  03/17/2018 18:21:00 Delivery type:  C-Section, Low Transverse Trial of labor:  No C-section categorization:  Primary     Baby Feeding: Bottle and Breast Disposition:home with mother   03/19/2018 Jacklyn Shell, CNM

## 2018-03-18 NOTE — Addendum Note (Signed)
Addendum  created 03/18/18 [id number] by Orlie Pollen, CRNA   Clinical Note Signed

## 2018-03-18 NOTE — Anesthesia Postprocedure Evaluation (Signed)
Anesthesia Post Note  Patient: Christina Schneider  Procedure(s) Performed: CESAREAN SECTION (N/A )     Patient location during evaluation: PACU Anesthesia Type: Spinal Level of consciousness: oriented and awake and alert Pain management: pain level controlled Vital Signs Assessment: post-procedure vital signs reviewed and stable Respiratory status: spontaneous breathing, respiratory function stable and patient connected to nasal cannula oxygen Cardiovascular status: blood pressure returned to baseline and stable Postop Assessment: no headache, no backache and no apparent nausea or vomiting Anesthetic complications: no    Last Vitals:  Vitals:   03/18/18 0330 03/18/18 0417  BP:    Pulse:    Resp:    Temp:    SpO2: 98% 99%    Last Pain:  Vitals:   03/18/18 0417  TempSrc:   PainSc: 3    Pain Goal:                   Christina Schneider

## 2018-03-18 NOTE — Anesthesia Postprocedure Evaluation (Signed)
Anesthesia Post Note  Patient: Christina Schneider  Procedure(s) Performed: CESAREAN SECTION (N/A )     Patient location during evaluation: PACU Anesthesia Type: Spinal Level of consciousness: oriented and awake and alert Pain management: pain level controlled Vital Signs Assessment: post-procedure vital signs reviewed and stable Respiratory status: spontaneous breathing, respiratory function stable and patient connected to nasal cannula oxygen Cardiovascular status: blood pressure returned to baseline and stable Postop Assessment: no headache, no backache and no apparent nausea or vomiting Anesthetic complications: no    Last Vitals:  Vitals:   03/18/18 0417 03/18/18 0551  BP:    Pulse:    Resp:    Temp:    SpO2: 99% 98%    Last Pain:  Vitals:   03/18/18 0551  TempSrc:   PainSc: 4    Pain Goal:                   [employer]

## 2018-03-18 NOTE — Progress Notes (Addendum)
Post Partum Day 1  Subjective: Pt doing well this morning. Pt has no complaints.  Reports she was able to ambulate to the bathroom this morning with assistance. Pain well controlled. Tolerating PO. +Flatus.  Objective: Blood pressure 129/67, pulse 70, temperature 98.1 F (36.7 C), temperature source Oral, resp. rate 18, weight 59.5 kg, last menstrual period 06/22/2017, SpO2 98 %.  Physical Exam:  General: alert, cooperative, appears stated age and no distress Lochia: appropriate Uterine Fundus: firm Incision: healing well, no significant drainage, no dehiscence, no significant erythema DVT Evaluation: No evidence of DVT seen on physical exam. Negative Homan's sign. No cords or calf tenderness. No significant calf/ankle edema.  Recent Labs    03/17/18 1620 03/18/18 0437  HGB 12.7 10.3*  HCT 38.7 31.4*    Assessment/Plan: Plan for discharge on PPD #3 Contraception: unsure between IUD or Nexplanon Breast/Bottle   LOS: 1 day   Brand Males 03/18/2018, 7:19 AM

## 2018-03-19 LAB — HEPATITIS B CORE ANTIBODY, TOTAL: HEP B C TOTAL AB: POSITIVE — AB

## 2018-03-19 LAB — RPR: RPR Ser Ql: NONREACTIVE

## 2018-03-19 LAB — HEPATITIS B SURFACE ANTIBODY, QUANTITATIVE: Hep B S AB Quant (Post): <3.1 m[IU]/mL — ABNORMAL LOW (ref >9.9)

## 2018-03-19 LAB — HEPATITIS B CORE ANTIBODY, IGM: Hep B C IgM: NEGATIVE

## 2018-03-19 MED ORDER — IBUPROFEN 800 MG PO TABS: 800.0000 mg | ORAL_TABLET | Freq: Four times a day (QID) | ORAL | 0 refills | Status: DC

## 2018-03-19 MED ORDER — OXYCODONE-ACETAMINOPHEN 5-325 MG PO TABS: 1.0000 | ORAL_TABLET | ORAL | 0 refills | Status: DC | PRN

## 2018-03-19 NOTE — Progress Notes (Addendum)
Post Partum Day 2 Subjective: no complaints, up ad lib, voiding, tolerating PO and + flatus. Complains of some mild abdominal pain, but her pain is well controlled with ibuprofen.   Objective: Blood pressure 98/66, pulse 64, temperature (!) 97.1 F (36.2 C), temperature source Oral, resp. rate 18, weight 59.5 kg, last menstrual period 06/22/2017, SpO2 99 %, unknown if currently breastfeeding.  Physical Exam:  General: alert, cooperative and no distress Lochia: appropriate Uterine Fundus: firm Incision: Clean, dry, and intact DVT Evaluation: No evidence of DVT seen on physical exam. Negative Homan's sign. No cords or calf tenderness.  Recent Labs    03/17/18 1620 03/18/18 0437  HGB 12.7 10.3*  HCT 38.7 31.4*    Assessment/Plan: Contraception IUD vs Nexplanon  Breast Feeding   LOS: 2 days   Shawnie Dapper PA-S2 03/19/2018, 7:25 AM    I personally saw and evaluated the patient, performing the key elements of the service. I developed and verified the management plan that is described in the resident's/student's note, and I agree with the content with my edits above. VSS, HRR&R, Resp unlabored, Legs neg. See separate discharge summary. Pt going home today Nigel Berthold, CNM 03/20/2018 5:12 PM

## 2018-03-19 NOTE — Discharge Instructions (Signed)

## 2018-03-21 ENCOUNTER — Encounter: Payer: Self-pay | Admitting: Advanced Practice Midwife

## 2018-03-21 DIAGNOSIS — B181 Chronic viral hepatitis B without delta-agent: Secondary | ICD-10-CM | POA: Insufficient documentation

## 2018-03-29 ENCOUNTER — Other Ambulatory Visit: Payer: Self-pay

## 2018-03-29 ENCOUNTER — Encounter (HOSPITAL_COMMUNITY): Payer: Self-pay | Admitting: *Deleted

## 2018-03-29 ENCOUNTER — Inpatient Hospital Stay (HOSPITAL_COMMUNITY)
Admission: AD | Admit: 2018-03-29 | Discharge: 2018-03-31 | DRG: 776 | Disposition: A | Discharge status: Home / Self Care | Payer: Medicaid Other | Attending: Obstetrics & Gynecology | Admitting: Obstetrics & Gynecology

## 2018-03-29 DIAGNOSIS — O9089 Other complications of the puerperium, not elsewhere classified: Secondary | ICD-10-CM | POA: Diagnosis present

## 2018-03-29 DIAGNOSIS — I340 Nonrheumatic mitral (valve) insufficiency: Secondary | ICD-10-CM | POA: Diagnosis not present

## 2018-03-29 DIAGNOSIS — O1495 Unspecified pre-eclampsia, complicating the puerperium: Secondary | ICD-10-CM

## 2018-03-29 DIAGNOSIS — R001 Bradycardia, unspecified: Secondary | ICD-10-CM | POA: Diagnosis present

## 2018-03-29 DIAGNOSIS — I361 Nonrheumatic tricuspid (valve) insufficiency: Secondary | ICD-10-CM | POA: Diagnosis not present

## 2018-03-29 DIAGNOSIS — R0602 Shortness of breath: Secondary | ICD-10-CM | POA: Diagnosis present

## 2018-03-29 DIAGNOSIS — O9989 Other specified diseases and conditions complicating pregnancy, childbirth and the puerperium: Secondary | ICD-10-CM | POA: Diagnosis not present

## 2018-03-29 DIAGNOSIS — O1415 Severe pre-eclampsia, complicating the puerperium: Secondary | ICD-10-CM

## 2018-03-29 LAB — BRAIN NATRIURETIC PEPTIDE: B Natriuretic Peptide: 283.1 pg/mL — ABNORMAL HIGH (ref 0.0–100.0)

## 2018-03-29 LAB — COMPREHENSIVE METABOLIC PANEL
ALT: 20 U/L (ref 0–44)
AST: 56 U/L — ABNORMAL HIGH (ref 15–41)
Albumin: 3.5 g/dL (ref 3.5–5.0)
Alkaline Phosphatase: 106 U/L (ref 38–126)
Anion gap: 7 (ref 5–15)
BUN: 10 mg/dL (ref 6–20)
CO2: 22 mmol/L (ref 22–32)
Calcium: 8.4 mg/dL — ABNORMAL LOW (ref 8.9–10.3)
Chloride: 109 mmol/L (ref 98–111)
Creatinine, Ser: 0.64 mg/dL (ref 0.44–1.00)
GFR calc Af Amer: >60 mL/min (ref >60)
GFR calc non Af Amer: >60 mL/min (ref >60)
Glucose, Bld: 74 mg/dL (ref 70–99)
POTASSIUM: 4.8 mmol/L (ref 3.5–5.1)
Sodium: 138 mmol/L (ref 135–145)
Total Bilirubin: 0.7 mg/dL (ref 0.3–1.2)
Total Protein: 5.7 g/dL — ABNORMAL LOW (ref 6.5–8.1)

## 2018-03-29 LAB — CBC
HCT: 34.9 % — ABNORMAL LOW (ref 36.0–46.0)
Hemoglobin: 11.2 g/dL — ABNORMAL LOW (ref 12.0–15.0)
MCH: 25.6 pg — ABNORMAL LOW (ref 26.0–34.0)
MCHC: 32.1 g/dL (ref 30.0–36.0)
MCV: 79.9 fL — ABNORMAL LOW (ref 80.0–100.0)
Platelets: 209 10*3/uL (ref 150–400)
RBC: 4.37 MIL/uL (ref 3.87–5.11)
RDW: 13.6 % (ref 11.5–15.5)
WBC: 6 10*3/uL (ref 4.0–10.5)
nRBC: 0 % (ref 0.0–0.2)

## 2018-03-29 LAB — PROTEIN / CREATININE RATIO, URINE
Creatinine, Urine: 28.24 mg/dL
Total Protein, Urine: <6 mg/dL

## 2018-03-29 MED ORDER — FUROSEMIDE 10 MG/ML IJ SOLN
20.0000 mg | Freq: Once | INTRAMUSCULAR | Status: AC
  Administered 2018-03-29: 20 mg via INTRAVENOUS
  Filled 2018-03-29 (×2): qty 2

## 2018-03-29 MED ORDER — ZOLPIDEM TARTRATE 5 MG PO TABS: 5.0000 mg | ORAL_TABLET | Freq: Every evening | ORAL | Status: DC | PRN

## 2018-03-29 MED ORDER — LACTATED RINGERS IV SOLN
INTRAVENOUS | Status: DC
  Administered 2018-03-29 – 2018-03-30 (×2): via INTRAVENOUS

## 2018-03-29 MED ORDER — LABETALOL HCL 5 MG/ML IV SOLN
40.0000 mg | INTRAVENOUS | Status: DC | PRN
  Administered 2018-03-29: 40 mg via INTRAVENOUS
  Filled 2018-03-29: qty 8

## 2018-03-29 MED ORDER — PRENATAL MULTIVITAMIN CH
1.0000 | ORAL_TABLET | Freq: Every day | ORAL | Status: DC
  Administered 2018-03-30 – 2018-03-31 (×2): 1 via ORAL
  Filled 2018-03-29 (×2): qty 1

## 2018-03-29 MED ORDER — LABETALOL HCL 5 MG/ML IV SOLN
80.0000 mg | INTRAVENOUS | Status: DC | PRN
  Administered 2018-03-29: 80 mg via INTRAVENOUS
  Filled 2018-03-29: qty 16

## 2018-03-29 MED ORDER — MAGNESIUM SULFATE BOLUS VIA INFUSION
4.0000 g | Freq: Once | INTRAVENOUS | Status: AC
  Administered 2018-03-29: 4 g via INTRAVENOUS
  Filled 2018-03-29: qty 500

## 2018-03-29 MED ORDER — LABETALOL HCL 5 MG/ML IV SOLN
20.0000 mg | INTRAVENOUS | Status: DC | PRN
  Administered 2018-03-29: 20 mg via INTRAVENOUS
  Filled 2018-03-29: qty 4

## 2018-03-29 MED ORDER — ACETAMINOPHEN 325 MG PO TABS: 650.0000 mg | ORAL_TABLET | ORAL | Status: DC | PRN

## 2018-03-29 MED ORDER — OXYCODONE-ACETAMINOPHEN 5-325 MG PO TABS: 1.0000 | ORAL_TABLET | ORAL | Status: DC | PRN

## 2018-03-29 MED ORDER — HYDRALAZINE HCL 20 MG/ML IJ SOLN
10.0000 mg | INTRAMUSCULAR | Status: DC | PRN
  Administered 2018-03-29: 10 mg via INTRAVENOUS
  Filled 2018-03-29: qty 1

## 2018-03-29 MED ORDER — MAGNESIUM SULFATE 40 G IN LACTATED RINGERS - SIMPLE
2.0000 g/h | INTRAVENOUS | Status: DC
  Administered 2018-03-29 – 2018-03-30 (×2): 2 g/h via INTRAVENOUS
  Filled 2018-03-29 (×2): qty 500

## 2018-03-29 MED ORDER — DOCUSATE SODIUM 100 MG PO CAPS
100.0000 mg | ORAL_CAPSULE | Freq: Every day | ORAL | Status: DC
  Administered 2018-03-30 – 2018-03-31 (×2): 100 mg via ORAL
  Filled 2018-03-29 (×2): qty 1

## 2018-03-29 MED ORDER — CALCIUM CARBONATE ANTACID 500 MG PO CHEW: 2.0000 | CHEWABLE_TABLET | ORAL | Status: DC | PRN

## 2018-03-29 NOTE — H&P (Signed)
Christina Schneider is a 38 y.o. female presenting 12 days post primary LTCS for breech presentation and admitted for postpartum preeclampsia with new onset severe range blood pressures and bradycardia.  She initially presented to MAU reporting an opening in the middle of her low transverse abdominal incision.  She took her dressing off yesterday, on 03/28/18, and noticed an area where the incision was open. There is no drainage, pain or fever.  She reports a mild h/a today, resolved with ibuprofen.  There are no other symptoms.  OB History    Gravida  3   Para  3   Term  3   Preterm      AB      Living  3     SAB      TAB      Ectopic      Multiple      Live Births  3          Past Medical History:  Diagnosis Date  . Chronic hepatitis B (HCC)    Per labs 2020   Past Surgical History:  Procedure Laterality Date  . CESAREAN SECTION N/A 03/17/2018   Procedure: CESAREAN SECTION;  Surgeon: Adam Phenix, MD;  Location: [facility] LD ORS;  Service: Obstetrics;  Laterality: N/A;  . CYSTECTOMY    . DENTAL SURGERY     Family History: family history is not on file. Social History:  reports that she has never smoked. She has never used smokeless tobacco. She reports that she does not drink alcohol or use drugs.       Review of Systems  Constitutional: Negative for chills and fever.  Respiratory: Negative for shortness of breath.   Cardiovascular: Negative for chest pain.  Gastrointestinal: Negative for abdominal pain, constipation, diarrhea and vomiting.  Neurological: Positive for headaches. Negative for dizziness.  All other systems reviewed and are negative.  Maternal Medical History:  Reason for admission: Postpartum preeclampsia  Prenatal complications: no prenatal complications Prenatal Complications - Diabetes: none.      Blood pressure (!) 150/70, pulse 72, temperature 98 F (36.7 C), temperature source Oral, resp. rate 16, SpO2 98 %, unknown if currently  breastfeeding. Exam Physical Exam  Nursing note and vitals reviewed. Constitutional: She is oriented to person, place, and time. She appears well-developed and well-nourished.  Neck: Normal range of motion.  Cardiovascular: Regular rhythm, normal heart sounds and normal pulses. Bradycardia present.  Respiratory: Effort normal.  GI: Soft.  Musculoskeletal: Normal range of motion.  Neurological: She is alert and oriented to person, place, and time.  Skin: Skin is warm and dry.  Psychiatric: She has a normal mood and affect. Her behavior is normal. Judgment and thought content normal.    Results for orders placed or performed during the hospital encounter of 03/29/18 (from the past 24 hour(s))  CBC     Status: Abnormal   Collection Time: 03/29/18  5:05 PM  Result Value Ref Range   WBC 6.0 4.0 - 10.5 K/uL   RBC 4.37 3.87 - 5.11 MIL/uL   Hemoglobin 11.2 (L) 12.0 - 15.0 g/dL   HCT 97.7 (L) 41.4 - 23.9 %   MCV 79.9 (L) 80.0 - 100.0 fL   MCH 25.6 (L) 26.0 - 34.0 pg   MCHC 32.1 30.0 - 36.0 g/dL   RDW 53.2 02.3 - 34.3 %   Platelets 209 150 - 400 K/uL   nRBC 0.0 0.0 - 0.2 %  Comprehensive metabolic panel     Status:  Abnormal   Collection Time: 03/29/18  5:05 PM  Result Value Ref Range   Sodium 138 135 - 145 mmol/L   Potassium 4.8 3.5 - 5.1 mmol/L   Chloride 109 98 - 111 mmol/L   CO2 22 22 - 32 mmol/L   Glucose, Bld 74 70 - 99 mg/dL   BUN 10 6 - 20 mg/dL   Creatinine, Ser 4.17 0.44 - 1.00 mg/dL   Calcium 8.4 (L) 8.9 - 10.3 mg/dL   Total Protein 5.7 (L) 6.5 - 8.1 g/dL   Albumin 3.5 3.5 - 5.0 g/dL   AST 56 (H) 15 - 41 U/L   ALT 20 0 - 44 U/L   Alkaline Phosphatase 106 38 - 126 U/L   Total Bilirubin 0.7 0.3 - 1.2 mg/dL   GFR calc non Af Amer >60 >60 mL/min   GFR calc Af Amer >60 >60 mL/min   Anion gap 7 5 - 15  Brain natriuretic peptide     Status: Abnormal   Collection Time: 03/29/18  5:22 PM  Result Value Ref Range   B Natriuretic Peptide 283.1 (H) 0.0 - 100.0 pg/mL  Protein /  creatinine ratio, urine     Status: None   Collection Time: 03/29/18  6:08 PM  Result Value Ref Range   Creatinine, Urine 28.24 mg/dL   Total Protein, Urine <6 mg/dL   Protein Creatinine Ratio        0.00 - 0.15 mg/mg[Cre]   EKG: sinus bradycardia, possible right atrial enlargement and possible bundle branch block.  MDM:  Pt with minimal symptoms, only mild h/a that resolved easily with ibuprofen, but severe range pressures requiring 2 medications on preeclampsia focused order set to treat. New onset severe HTN and bradycardia today. PEC labs wnl but BNP elevated. Consult Dr Excell Seltzer, cardiologist, who evaluated EKG.  No further workup for EKG which is benign but with symptoms recommend echocardiogram.  Consult Dr Emelda Fear with assessment and findings. Admit for preeclampsia with echo ordered on admission.  Assessment/Plan: Postpartum preeclampsia  Admit to HROB Unit Mag sulfate 4 g bolus, 2 g/hour Lasix 20 mg IV x 1 dose  Echocardiogram ordered    Sharen Counter 03/29/2018, 8:06 PM

## 2018-03-29 NOTE — MAU Note (Signed)
EKG in process

## 2018-03-29 NOTE — MAU Note (Signed)
Pt up to BR to collect urine specimen.

## 2018-03-29 NOTE — MAU Provider Note (Signed)
Chief Complaint: Wound Dehiscence   First Provider Initiated Contact with Patient 03/29/18 1707      SUBJECTIVE HPI: Christina Schneider is a 38 y.o. G3P3003, pt of [facility] who is 12 days s/p primary LTCS for breech presentation who presents to maternity admissions reporting an opening in the middle of her low transverse abdominal incision.  She took her dressing off yesterday, on 03/28/18, and noticed an area where the incision was open.  There is no drainage, pain or fever.  She reports a mild h/a today, resolved with ibuprofen.  There are no other symptoms.   HPI  Past Medical History:  Diagnosis Date  . Chronic hepatitis B (HCC)    Per labs 2020   Past Surgical History:  Procedure Laterality Date  . CESAREAN SECTION N/A 03/17/2018   Procedure: CESAREAN SECTION;  Surgeon: Adam Phenix, MD;  Location: [facility] LD ORS;  Service: Obstetrics;  Laterality: N/A;  . CYSTECTOMY    . DENTAL SURGERY     Social History   Socioeconomic History  . Marital status: Single    Spouse name: Not on file  . Number of children: 2  . Years of education: Not on file  . Highest education level: Not on file  Occupational History  . Occupation: WAITRESS    Employer: [employer]  Social Needs  . Financial resource strain: Not on file  . Food insecurity:    Worry: Not on file    Inability: Not on file  . Transportation needs:    Medical: Not on file    Non-medical: Not on file  Tobacco Use  . Smoking status: Never Smoker  . Smokeless tobacco: Never Used  Substance and Sexual Activity  . Alcohol use: No  . Drug use: No  . Sexual activity: Yes    Birth control/protection: None  Lifestyle  . Physical activity:    Days per week: Not on file    Minutes per session: Not on file  . Stress: Not on file  Relationships  . Social connections:    Talks on phone: Not on file    Gets together: Not on file    Attends religious service: Not on file    Active member of club or organization: Not on file    Attends  meetings of clubs or organizations: Not on file    Relationship status: Not on file  . Intimate partner violence:    Fear of current or ex partner: Not on file    Emotionally abused: Not on file    Physically abused: Not on file    Forced sexual activity: Not on file  Other Topics Concern  . Not on file  Social History Narrative   Originally from [place].   Came to the [place] in 2002.   Lives with her boyfriend and her two daughters.   No current facility-administered medications on file prior to encounter.    Current Outpatient Medications on File Prior to Encounter  Medication Sig Dispense Refill  . ibuprofen (ADVIL,MOTRIN) 800 MG tablet Take 1 tablet (800 mg total) by mouth every 6 (six) hours. 30 tablet 0  . oxyCODONE-acetaminophen (PERCOCET/ROXICET) 5-325 MG tablet Take 1-2 tablets by mouth every 4 (four) hours as needed for moderate pain. 30 tablet 0   No Known Allergies  ROS:  Review of Systems  Constitutional: Negative for chills, fatigue and fever.  Eyes: Negative for visual disturbance.  Respiratory: Negative for shortness of breath.   Cardiovascular: Negative for chest pain.  Gastrointestinal: Negative for abdominal pain, nausea and vomiting.  Genitourinary: Negative for difficulty urinating, dysuria, flank pain, pelvic pain, vaginal bleeding, vaginal discharge and vaginal pain.  Neurological: Positive for headaches. Negative for dizziness.  Psychiatric/Behavioral: Negative.      I have reviewed patient's Past Medical Hx, Surgical Hx, Family Hx, Social Hx, medications and allergies.   Physical Exam   Patient Vitals for the past 24 hrs:  BP Temp Temp src Pulse Resp SpO2  03/29/18 2015 (!) 158/74 98.2 F (36.8 C) Oral 69 17 98 %  03/29/18 2001 (!) 150/70 - - 72 - -  03/29/18 1946 (!) 153/71 - - 78 - -  03/29/18 1931 (!) 146/69 - - 77 - -  03/29/18 1924 (!) 147/68 - - 74 - -  03/29/18 1900 (!) 144/66 - - 74 - 98 %  03/29/18 1855 - - - - - 99 %  03/29/18 1845 (!)  141/68 - - 75 - 98 %  03/29/18 1840 - - - - - 98 %  03/29/18 1831 (!) 147/61 - - 68 - -  03/29/18 1830 - - - - - 98 %  03/29/18 1820 - - - - - 99 %  03/29/18 1816 (!) 163/67 - - (!) 52 - -  03/29/18 1810 - - - - - 99 %  03/29/18 1805 (!) 177/62 - - (!) 55 - -  03/29/18 1750 - - - - - 98 %  03/29/18 1746 (!) 184/71 - - (!) 50 - -  03/29/18 1731 (!) 169/69 - - (!) 58 - -  03/29/18 1716 (!) 183/77 - - (!) 47 - -  03/29/18 1701 (!) 179/76 - - (!) 47 - -  03/29/18 1644 (!) 179/76 - - (!) 48 - -  03/29/18 1635 (!) 168/84 98 F (36.7 C) Oral (!) 52 16 -   Constitutional: Well-developed, well-nourished female in no acute distress.  HEART: bradycardia with heart rate high 40s, otherwise normal heart sounds, regular rhythm RESP: normal effort, lung sounds clear and equal bilaterally GI: Abd soft, non-tender. Pos BS x 4 MS: Extremities nontender, no edema, normal ROM Neurologic: Alert and oriented x 4.  GU: Neg CVAT. Skin: incision well approximated except for area to right of incision, approximately 4 cm from right edge that has small superficial dehiscence with no tunneling, only overlap of tissue by less than 0.5 cm approximately 1 cm in length along incision.      LAB RESULTS Results for orders placed or performed during the hospital encounter of 03/29/18 (from the past 24 hour(s))  CBC     Status: Abnormal   Collection Time: 03/29/18  5:05 PM  Result Value Ref Range   WBC 6.0 4.0 - 10.5 K/uL   RBC 4.37 3.87 - 5.11 MIL/uL   Hemoglobin 11.2 (L) 12.0 - 15.0 g/dL   HCT 40.9 (L) 81.1 - 91.4 %   MCV 79.9 (L) 80.0 - 100.0 fL   MCH 25.6 (L) 26.0 - 34.0 pg   MCHC 32.1 30.0 - 36.0 g/dL   RDW 78.2 95.6 - 21.3 %   Platelets 209 150 - 400 K/uL   nRBC 0.0 0.0 - 0.2 %  Comprehensive metabolic panel     Status: Abnormal   Collection Time: 03/29/18  5:05 PM  Result Value Ref Range   Sodium 138 135 - 145 mmol/L   Potassium 4.8 3.5 - 5.1 mmol/L   Chloride 109 98 - 111 mmol/L   CO2 22 22 - 32  mmol/L   Glucose, Bld 74 70 - 99 mg/dL   BUN 10 6 - 20 mg/dL   Creatinine, Ser 1.61 0.44 - 1.00 mg/dL   Calcium 8.4 (L) 8.9 - 10.3 mg/dL   Total Protein 5.7 (L) 6.5 - 8.1 g/dL   Albumin 3.5 3.5 - 5.0 g/dL   AST 56 (H) 15 - 41 U/L   ALT 20 0 - 44 U/L   Alkaline Phosphatase 106 38 - 126 U/L   Total Bilirubin 0.7 0.3 - 1.2 mg/dL   GFR calc non Af Amer >60 >60 mL/min   GFR calc Af Amer >60 >60 mL/min   Anion gap 7 5 - 15  Brain natriuretic peptide     Status: Abnormal   Collection Time: 03/29/18  5:22 PM  Result Value Ref Range   B Natriuretic Peptide 283.1 (H) 0.0 - 100.0 pg/mL  Protein / creatinine ratio, urine     Status: None   Collection Time: 03/29/18  6:08 PM  Result Value Ref Range   Creatinine, Urine 28.24 mg/dL   Total Protein, Urine <6 mg/dL   Protein Creatinine Ratio        0.00 - 0.15 mg/mg[Cre]    --/--/A POS, A POS Performed at [facility], [address], [place], [place] [address]  (03/08 1615)  IMAGING No results found.  MAU Management/MDM: Orders Placed This Encounter  Procedures  . CBC  . Comprehensive metabolic panel  . Brain natriuretic peptide  . Protein / creatinine ratio, urine  . Diet regular Room service appropriate? Yes; Fluid consistency: Thin  . Notify Physician  . Measure blood pressure  . Notify physician (specify)  . Vital signs  . Defer vaginal exam for vaginal bleeding or PROM <37 weeks  . Initiate Oral Care Protocol  . Initiate Carrier Fluid Protocol  . SCDs  . Activity as tolerated  . Full code  . ED EKG  . ECHOCARDIOGRAM COMPLETE    Meds ordered this encounter  Medications  . AND Linked Order Group   . labetalol (NORMODYNE,TRANDATE) injection 20 mg   . labetalol (NORMODYNE,TRANDATE) injection 40 mg   . labetalol (NORMODYNE,TRANDATE) injection 80 mg   . hydrALAZINE (APRESOLINE) injection 10 mg  . lactated ringers infusion  . oxyCODONE-acetaminophen (PERCOCET/ROXICET) 5-325 MG per tablet 1-2 tablet  . acetaminophen  (TYLENOL) tablet 650 mg  . zolpidem (AMBIEN) tablet 5 mg  . docusate sodium (COLACE) capsule 100 mg  . calcium carbonate (TUMS - dosed in mg elemental calcium) chewable tablet 400 mg of elemental calcium  . prenatal multivitamin tablet 1 tablet  . furosemide (LASIX) injection 20 mg  . magnesium bolus via infusion 4 g  . magnesium sulfate 40 grams in LR 500 mL OB infusion    EKG: sinus bradycardia, possible right atrial enlargement and possible bundle branch block.  MDM:  Pt with minimal symptoms, only mild h/a that resolved easily with ibuprofen, but severe range pressures requiring 2 medications on preeclampsia focused order set to treat. Incision is well approximated with normal healing process at 12 days postop. New onset severe HTN and bradycardia today. PEC labs wnl but BNP elevated. Consult Dr Excell Seltzer, cardiologist, who evaluated EKG.  No further workup for EKG which is benign but with symptoms recommend echocardiogram.  Consult Dr Emelda Fear with assessment and findings. Admit for preeclampsia with echo ordered on admission.  ASSESSMENT 1. Preeclampsia in postpartum period     PLAN Admit to HROB Unit See H&P   Sharen Counter Certified  Nurse-Midwife 03/29/2018  8:22 PM

## 2018-03-29 NOTE — MAU Note (Signed)
Pt had C/S on March 8, no issues.  Pt took dressing off yesterday, states she noted incision had opened up "a little bit."  Denies any drainage or fever.

## 2018-03-30 ENCOUNTER — Other Ambulatory Visit: Payer: Self-pay

## 2018-03-30 ENCOUNTER — Encounter (HOSPITAL_COMMUNITY): Payer: Self-pay | Admitting: *Deleted

## 2018-03-30 ENCOUNTER — Inpatient Hospital Stay (HOSPITAL_COMMUNITY): Payer: Medicaid Other

## 2018-03-30 DIAGNOSIS — I361 Nonrheumatic tricuspid (valve) insufficiency: Secondary | ICD-10-CM

## 2018-03-30 DIAGNOSIS — O1495 Unspecified pre-eclampsia, complicating the puerperium: Secondary | ICD-10-CM | POA: Diagnosis present

## 2018-03-30 DIAGNOSIS — I340 Nonrheumatic mitral (valve) insufficiency: Secondary | ICD-10-CM

## 2018-03-30 LAB — ECHOCARDIOGRAM LIMITED

## 2018-03-30 MED ORDER — FUROSEMIDE 10 MG/ML IJ SOLN
20.0000 mg | Freq: Once | INTRAMUSCULAR | Status: AC
  Administered 2018-03-30: 20 mg via INTRAVENOUS
  Filled 2018-03-30: qty 2

## 2018-03-30 MED ORDER — BACITRACIN-NEOMYCIN-POLYMYXIN 400-5-5000 EX OINT
TOPICAL_OINTMENT | Freq: Two times a day (BID) | CUTANEOUS | Status: DC
  Administered 2018-03-30 – 2018-03-31 (×3): 1 via TOPICAL
  Filled 2018-03-30 (×2): qty 1

## 2018-03-30 NOTE — Progress Notes (Deleted)
Subjective: Postpartum Day 12: Cesarean Delivery Patient reports feeling well. She reports improvement in her breathing. She feels much better than yesterday evening. She is eager to be discharged    Objective: Vital signs in last 24 hours: Temp:  [97.7 F (36.5 C)-98.2 F (36.8 C)] 97.9 F (36.6 C) (03/21 0800) Pulse Rate:  [47-85] 72 (03/21 0800) Resp:  [16-19] 18 (03/21 0800) BP: (102-184)/(55-84) 111/68 (03/21 0800) SpO2:  [96 %-100 %] 98 % (03/21 0800)  Physical Exam:  General: alert, cooperative and no distress Lochia: appropriate Uterine Fundus: firm Incision: healing well, no significant drainage, no significant erythema. Small area measuring 5 mm of skin separation midline DVT Evaluation: No evidence of DVT seen on physical exam.  Recent Labs    03/29/18 1705  HGB 11.2*  HCT 34.9*    Assessment/Plan: Status post Cesarean section with postpartum preeclampsia  - Continue magnesium sulfate for 24 hours - Continue monitoring BP which are currently in the normal range - Follow up chest x-ray and maternal echo - Continue current care  Sharena Dibenedetto 03/30/2018, 11:20 AM

## 2018-03-30 NOTE — Progress Notes (Signed)
  Echocardiogram 2D Echocardiogram has been performed.  Christina Schneider 03/30/2018, 11:47 AM

## 2018-03-30 NOTE — Progress Notes (Addendum)
Subjective: Postpartum Day 12 after: Cesarean Delivery for breech, readmission Day 2. Admitted yesterday with pressures in Severe range systolic and normal LFT, plt,  Patient reports no problems voiding.  Diuresis p lasix was good. Scheduled for cardiac echo, and cxr this a.m  Objective: Vital signs in last 24 hours: Temp:  [97.7 F (36.5 C)-98.2 F (36.8 C)] 97.9 F (36.6 C) (03/21 0800) Pulse Rate:  [47-85] 72 (03/21 0800) Resp:  [16-19] 18 (03/21 0800) BP: (102-184)/(55-84) 111/68 (03/21 0800) SpO2:  [96 %-100 %] 98 % (03/21 0800) Visit vitals this a.m. BP 111/68 (BP Location: Right Arm)   Pulse 72   Temp 97.9 F (36.6 C) (Oral)   Resp 18   SpO2 98%   Physical Exam:  General: alert, cooperative and no distress Lochia: appropriate Uterine Fundus: involuted not felt. Incision: healing well DVT Evaluation: No evidence of DVT seen on physical exam.  Recent Labs    03/29/18 1705  HGB 11.2*  HCT 34.9*    Assessment/Plan: Status post Cesarean section. Postoperative course complicated by postpartum HTN PreE.  38 yr female. [id number] s/p cesarean for breech with MAU eval for incision check found to have: 1. Severe range BP's with normal LFT, PR/CR ratio, and Creatinine, platelets.  Will treat with Mag Sulfate  x 24 hr for postpartum preeclampsia as precaution. No history of Chronic HTN. No elevated pressures at delivery. 2. Mild SOB, evaluated with EKG Sinus bradycardia, hr 49, with normal EKG otherwise. BNP elevated at 283. Discussed with Cardiology who recommended Cardiac Echo which is ordered for Saturday AM. CXR ordered for Sat AM. 3    Anticipate inpt care til Sunday AM. .  Tilda Burrow 03/30/2018, 9:46 AM

## 2018-03-30 NOTE — Plan of Care (Signed)
  Problem: Education: Goal: Knowledge of disease or condition will improve Outcome: Progressing   

## 2018-03-31 DIAGNOSIS — O9989 Other specified diseases and conditions complicating pregnancy, childbirth and the puerperium: Secondary | ICD-10-CM

## 2018-03-31 DIAGNOSIS — O1495 Unspecified pre-eclampsia, complicating the puerperium: Principal | ICD-10-CM

## 2018-03-31 MED ORDER — HYDROCHLOROTHIAZIDE 12.5 MG PO TABS: 12.5000 mg | ORAL_TABLET | Freq: Every day | ORAL | 0 refills | Status: DC

## 2018-03-31 NOTE — Discharge Summary (Signed)
Physician Discharge Summary  Patient ID: Christina Schneider MRN: [id number] DOB/AGE: [birth year] 38 y.o.  Admit date: 03/29/2018 Discharge date: 03/31/2018   Discharge Diagnoses:  Active Problems:   Preeclampsia in postpartum period   Consults: None  Significant Diagnostic Studies: Pr:Cr negative CMP     Component Value Date/Time   NA 138 03/29/2018 1705   NA 141 06/11/2017 1753   K 4.8 03/29/2018 1705   CL 109 03/29/2018 1705   CO2 22 03/29/2018 1705   GLUCOSE 74 03/29/2018 1705   BUN 10 03/29/2018 1705   BUN 10 06/11/2017 1753   CREATININE 0.64 03/29/2018 1705   CREATININE 0.65 07/03/2011 1332   CALCIUM 8.4 (L) 03/29/2018 1705   PROT 5.7 (L) 03/29/2018 1705   PROT 7.0 06/11/2017 1753   ALBUMIN 3.5 03/29/2018 1705   ALBUMIN 4.5 06/11/2017 1753   AST 56 (H) 03/29/2018 1705   ALT 20 03/29/2018 1705   ALKPHOS 106 03/29/2018 1705   BILITOT 0.7 03/29/2018 1705   BILITOT 0.2 06/11/2017 1753   GFRNONAA >60 03/29/2018 1705   GFRAA >60 03/29/2018 1705    CBC    Component Value Date/Time   WBC 6.0 03/29/2018 1705   RBC 4.37 03/29/2018 1705   HGB 11.2 (L) 03/29/2018 1705   HCT 34.9 (L) 03/29/2018 1705   PLT 209 03/29/2018 1705   MCV 79.9 (L) 03/29/2018 1705   MCV 81.4 06/11/2017 1743   MCH 25.6 (L) 03/29/2018 1705   MCHC 32.1 03/29/2018 1705   RDW 13.6 03/29/2018 1705   Lab Results  Component Value Date   BNP 283.1 (H) 03/29/2018    Dg Chest 2 View  Result Date: 03/30/2018 CLINICAL DATA:  Pt complains of elevated blood pressure onset yesterday; denies CP or SOB; recently gave birth via c-section EXAM: CHEST - 2 VIEW COMPARISON:  Chest x-ray dated 07/19/2016. FINDINGS: Heart size and mediastinal contours are within normal limits. Lungs are clear. No pleural effusion or pneumothorax seen. Osseous structures about the chest are unremarkable. IMPRESSION: No active cardiopulmonary disease. No evidence of pneumonia or pulmonary edema. Electronically Signed   By: Bary Richard  M.D.   On: 03/30/2018 13:47    ECHO  1. The left ventricle has normal systolic function with an ejection fraction of 60-65%. The cavity size was normal. There is mildly increased left ventricular wall thickness. Left ventricular diastolic parameters were normal.  2. The right ventricle has normal systolic function. The cavity was normal. There is no increase in right ventricular wall thickness.  3. No stenosis of the aortic valve.  4. The aortic root and ascending aorta are normal in size and structure.  Hospital Course: Arrived with issue with incision. Noted to have markedly elevated BP's and bradycardia. Cards curbdsided due to elevated BNP and bradycardia. ECHO done and normal. Normal CXR. Received Magnesium Sulfate x 24 hours. Given IV lasix. BP was WNL. Felt much better and was deemed stable for discharge.  Disposition: Home Discharged Condition: good  Discharge Instructions    Diet - low sodium heart healthy   Complete by:  As directed      Allergies as of 03/31/2018   No Known Allergies     Medication List    TAKE these medications   hydrochlorothiazide 12.5 MG tablet Commonly known as:  HYDRODIURIL Take 1 tablet (12.5 mg total) by mouth daily.   ibuprofen 800 MG tablet Commonly known as:  ADVIL,MOTRIN Take 1 tablet (800 mg total) by mouth every 6 (six) hours.   oxyCODONE-acetaminophen  5-325 MG tablet Commonly known as:  PERCOCET/ROXICET Take 1-2 tablets by mouth every 4 (four) hours as needed for moderate pain.      Follow-up Information    Department, [facility] Follow up.   Contact information: [address] [place] [address] [phone]           Signed: Reva Bores 03/31/2018, 1:23 PM

## 2019-01-25 IMAGING — US US MFM FETAL NUCHAL TRANSLUCENCY
1 series · 14 of 28 positions shown · non-contrast
Comparison: none

[Series 1: us mfm fetal nuchal translucency · 67 acquisitions, 14 frames shown]
[im 3/67]
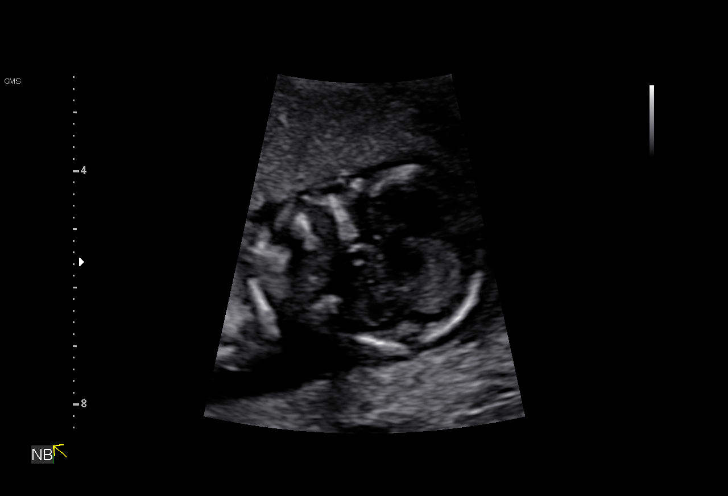
[im 8/67]
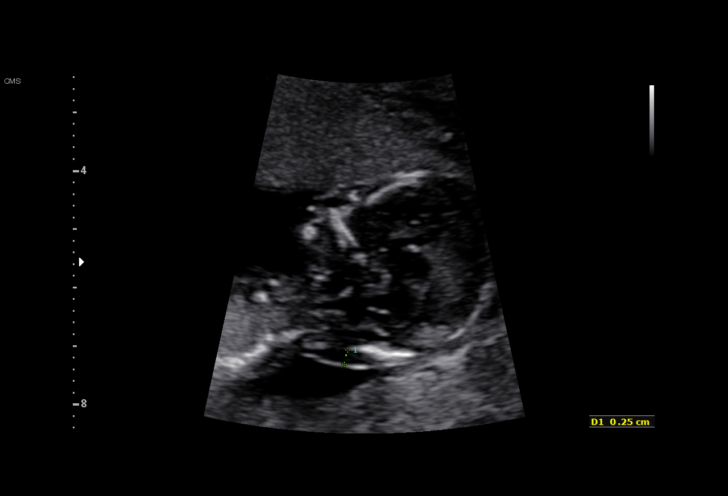
[im 13/67]
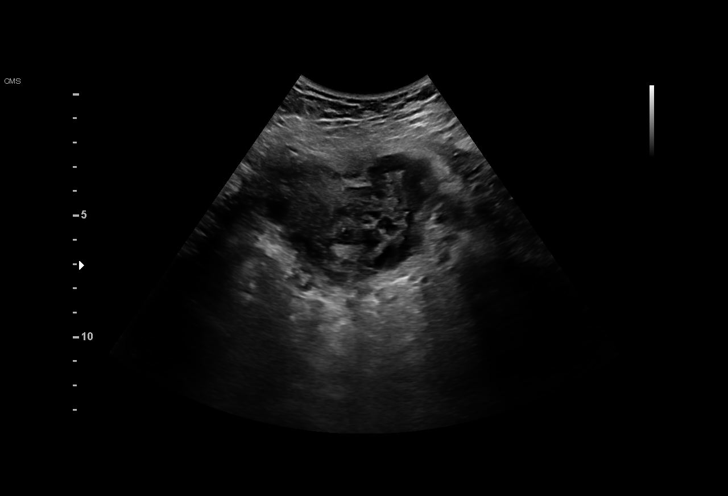
[im 18/67]
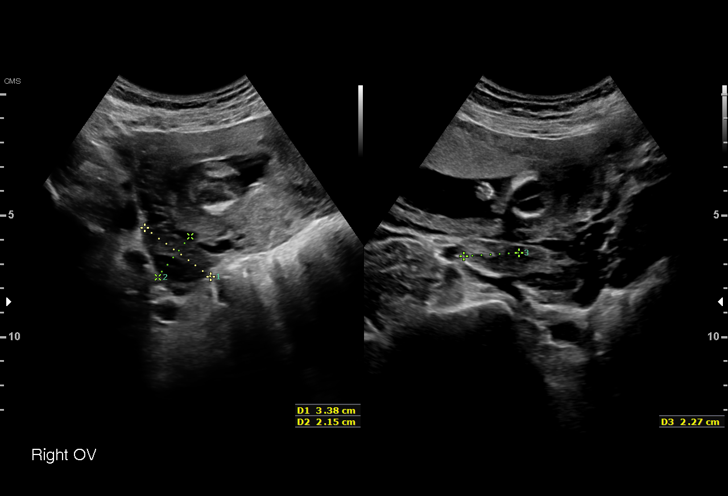
[im 23/67]
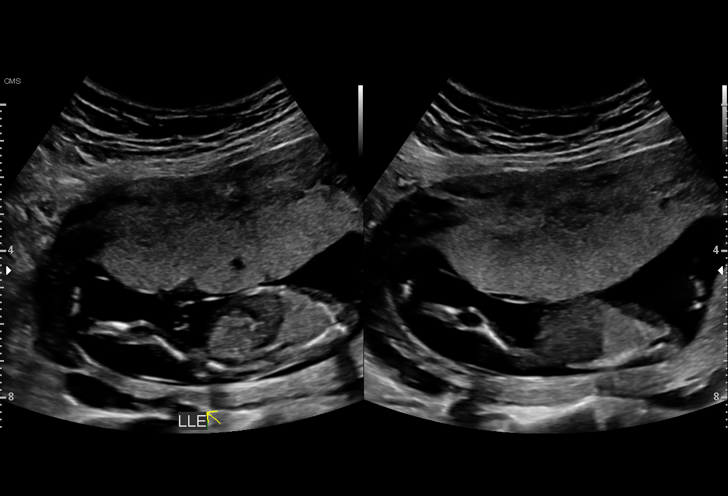
[im 27/67]
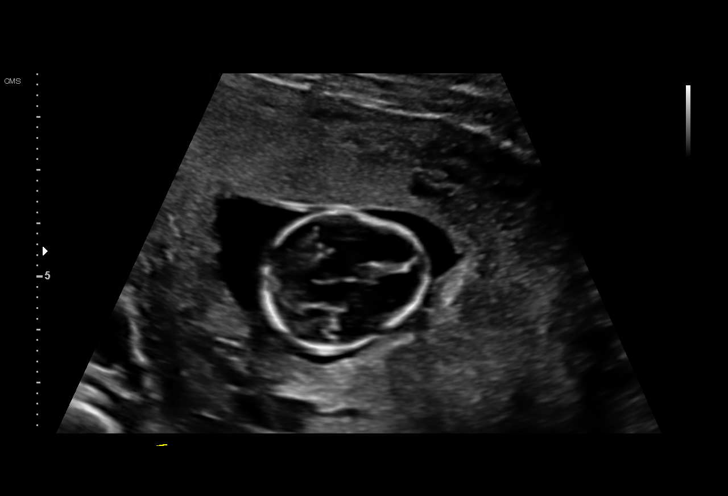
[im 32/67]
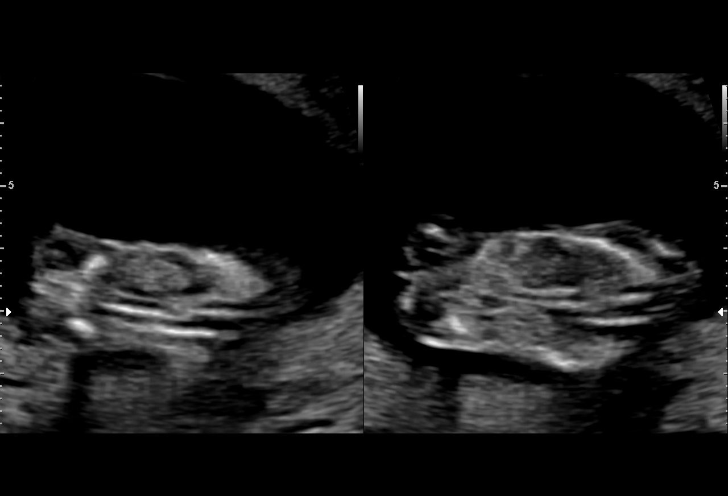
[im 37/67]
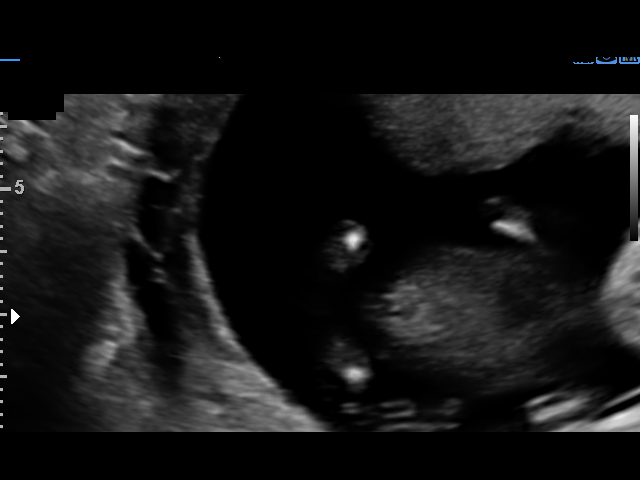
[im 42/67]
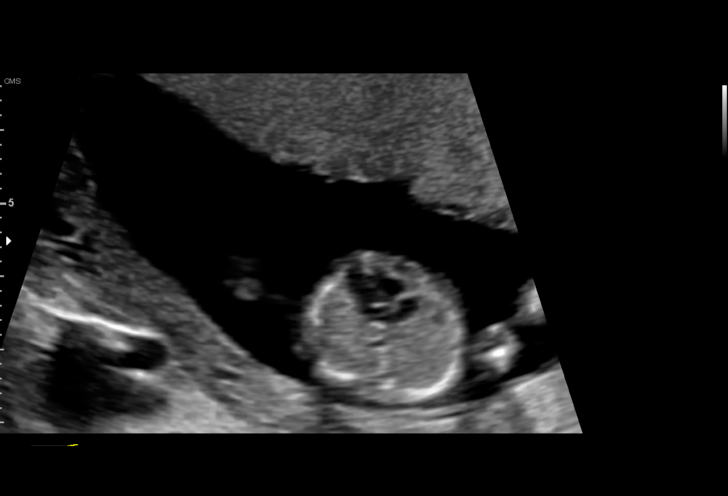
[im 47/67]
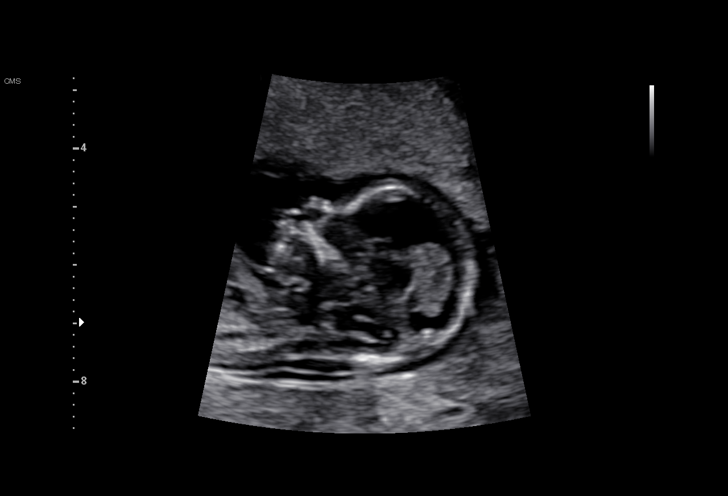
[im 52/67]
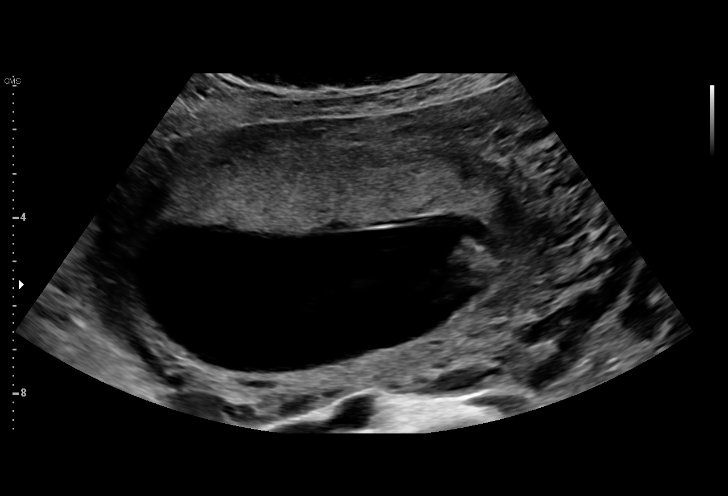
[im 57/67]
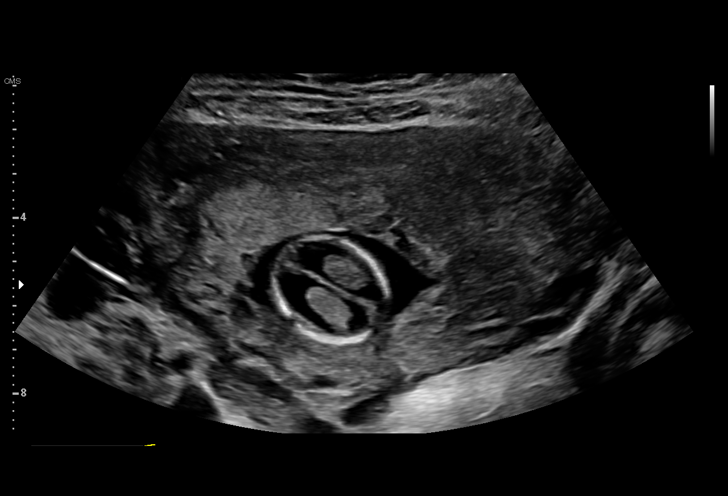
[im 62/67]
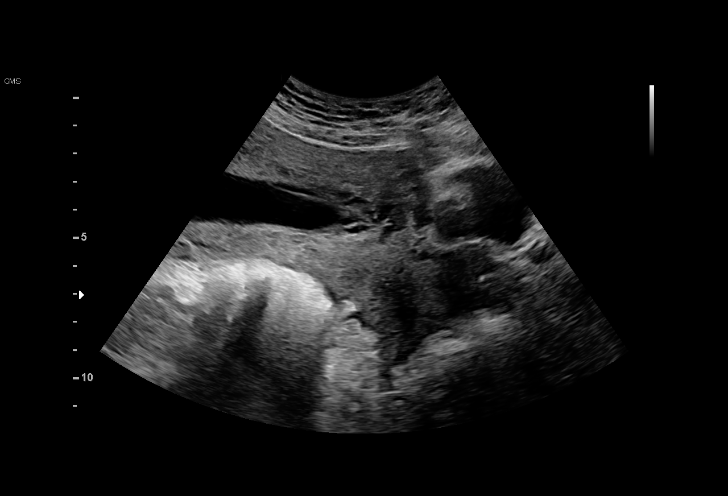
[im 67/67]
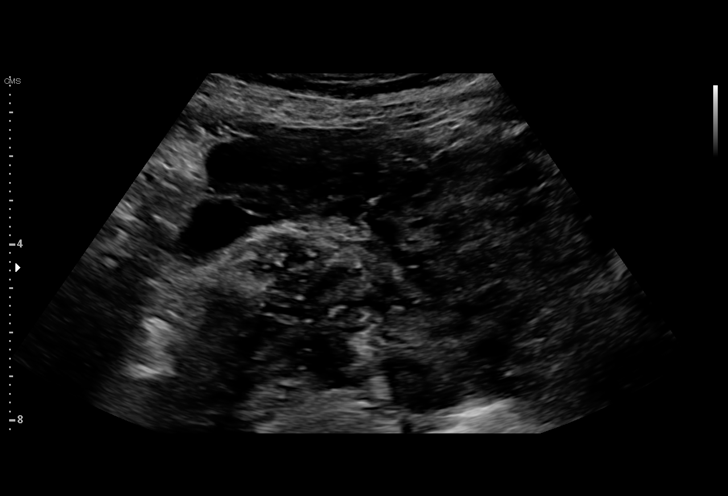

[14 of 28 positions shown; findings below may reference images not displayed]

[REDACTED]-
Faculty Physician

TRANSLUCENCY

Indications

13 weeks gestation of pregnancy
Advanced maternal age multigravida 37,
second trimester
Encounter for nuchal translucency
History of sickle cell trait
Fetal Evaluation

Num Of Fetuses:         1
Preg. Location:         Intrauterine
Gest. Sac:              Intrauterine
Fetal Pole:             Visualized
Fetal Heart Rate(bpm):  162
Cardiac Activity:       Observed

Amniotic Fluid
AFI FV:      Within normal limits
OB History

Gravidity:    3         Term:   2        Prem:   0        SAB:   0
TOP:          0       Ectopic:  0        Living: 2
Gestational Age

LMP:           13w 5d        Date:  06/22/17                 EDD:   03/29/18
Best:          13w 5d     Det. By:  LMP  (06/22/17)          EDD:   03/29/18
1st Trimester Genetic Sonogram Screening
CRL:            77.4  mm    G. Age:   13w 3d                 EDD:   03/31/18
Nuc Trans:       2.3  mm
Nasal Bone:                 Present
Anatomy

Cranium:               Visualized             Stomach:                Visualed, left sided
Cavum:                 Visualized             Abdominal Wall:         Visualized
Choroid Plexus:        Visualized             Cord Vessels:           Appears normal (3
vessel cord)
Nuchal Fold:           Not applicable (< 16   Kidneys:                Visualized
wks GA)
Face:                  Profile visualized     Bladder:                Visualized
RVOT:                  Visualized             Upper Extremities:      Visualized
LVOT:                  Visualized             Lower Extremities:      Visualized
Aortic Arch:           Visualized
Cervix Uterus Adnexa

Cervix
Closed

Uterus
No abnormality visualized.

Left Ovary
Within normal limits.

Right Ovary
Not visualized.

Cul De Sac
No free fluid seen.

Adnexa
No abnormality visualized.
Impression

On ultrasound, the CRL measurement is consistent with her
previously-established dates and good fetal heart activity is
seen. The nuchal translucency (NT) measures
millimeters, which is normal.  Fetal anatomy that could be
ascertained at this gestational age is normal.

Patient declined genetic counseling and opted to have first-
trimester screening. Blood was drawn for RGUEZ and beta
hCG. We will communicate first-trimester screening results to
the patient.
Recommendations

Fetal anatomy scan at 20 weeks.

## 2021-03-31 LAB — CYTOLOGY - PAP: Pap: NEGATIVE

## 2022-01-26 ENCOUNTER — Encounter: Payer: Medicaid Other | Admitting: Obstetrics and Gynecology

## 2022-06-09 ENCOUNTER — Telehealth: Payer: Self-pay

## 2022-06-09 NOTE — Telephone Encounter (Signed)
Telephoned patient at mobile number. No answer and mailbox full. [facility] (scholarship)

## 2023-04-30 ENCOUNTER — Ambulatory Visit (HOSPITAL_COMMUNITY)
Admission: EM | Admit: 2023-04-30 | Discharge: 2023-04-30 | Disposition: A | Discharge status: Home / Self Care | Attending: Nurse Practitioner | Admitting: Nurse Practitioner

## 2023-04-30 ENCOUNTER — Encounter (HOSPITAL_COMMUNITY): Payer: Self-pay

## 2023-04-30 DIAGNOSIS — R102 Pelvic and perineal pain: Secondary | ICD-10-CM

## 2023-04-30 LAB — POCT URINALYSIS DIP (MANUAL ENTRY)
Bilirubin, UA: NEGATIVE
Blood, UA: NEGATIVE
Glucose, UA: NEGATIVE mg/dL
Ketones, POC UA: NEGATIVE mg/dL
Leukocytes, UA: NEGATIVE
Nitrite, UA: NEGATIVE
Protein Ur, POC: NEGATIVE mg/dL
Spec Grav, UA: 1.02 (ref 1.010–1.025)
Urobilinogen, UA: 0.2 U/dL
pH, UA: 6 (ref 5.0–8.0)

## 2023-04-30 LAB — POCT URINE PREGNANCY: Preg Test, Ur: NEGATIVE

## 2023-04-30 NOTE — ED Triage Notes (Signed)
 Pt c/o LLQ pain x3 days. States has had same sx's in pain if she takes too much tylenol  or Advil . States took Advil  yesterday with relief.

## 2023-04-30 NOTE — Discharge Instructions (Addendum)
 You have been diagnosed with pelvic pain.  This may be related to ovulation.  You are encouraged to continue to monitor pain for you are encouraged to follow-up with GYN for further evaluation and ultrasound to rule out ovarian cyst. As discussed you are encouraged to use Tylenol  and/or topical heating pads to help with any pain since other medications can cause heartburn.

## 2023-04-30 NOTE — ED Provider Notes (Signed)
 [facility]    CSN: [id number] Arrival date & time: 04/30/23  0809      History   Chief Complaint Chief Complaint  Patient presents with   Abdominal Pain    HPI Christina Schneider is a 43 y.o. female.   HPI  Seen today for evaluation of left lower quadrant pain.  She endorses that the pain started about 3 days ago.  She has been taking Advil .  She endorses that the Advil  causes heartburn.  She denies headache, dizziness, chest pain, shortness of breath, nausea, vomiting, diarrhea constipation, dysuria, or abnormal bleeding.  She endorses that her menstrual cycles April 9. Past Medical History:  Diagnosis Date   Chronic hepatitis B (HCC)    Per labs 2020    Patient Active Problem List   Diagnosis Date Noted   Preeclampsia in postpartum period 03/30/2018   Chronic hepatitis B (HCC)    Hep B complicating pregnancy 03/18/2018   Cesarean delivery delivered 03/18/2018   Breech presentation 03/17/2018    Past Surgical History:  Procedure Laterality Date   CESAREAN SECTION N/A 03/17/2018   Procedure: CESAREAN SECTION;  Surgeon: Tresia Fruit, MD;  Location: [facility];  Service: Obstetrics;  Laterality: N/A;   CYSTECTOMY     DENTAL SURGERY      OB History     Gravida  3   Para  3   Term  3   Preterm      AB      Living  3      SAB      IAB      Ectopic      Multiple      Live Births  3            Home Medications    Prior to Admission medications   Not on File    Family History History reviewed. No pertinent family history.  Social History Social History   Tobacco Use   Smoking status: Never   Smokeless tobacco: Never  Vaping Use   Vaping status: Never Used  Substance Use Topics   Alcohol use: No   Drug use: No     Allergies   Patient has no known allergies.   Review of Systems Review of Systems   Physical Exam Triage Vital Signs ED Triage Vitals  Encounter Vitals Group     BP 04/30/23 0836 (!) 143/97      Systolic BP Percentile --      Diastolic BP Percentile --      Pulse Rate 04/30/23 0836 85     Resp 04/30/23 0836 18     Temp 04/30/23 0836 97.7 F (36.5 C)     Temp Source 04/30/23 0836 Oral     SpO2 04/30/23 0836 98 %     Weight --      Height --      Head Circumference --      Peak Flow --      Pain Score 04/30/23 0837 8     Pain Loc --      Pain Education --      Exclude from Growth Chart --    No data found.  Updated Vital Signs BP 123/85 (BP Location: Right Arm)   Pulse 85   Temp 97.7 F (36.5 C) (Oral)   Resp 18   LMP 04/18/2023   SpO2 98%   Breastfeeding No   Visual Acuity Right Eye Distance:   Left Eye Distance:  Bilateral Distance:    Right Eye Near:   Left Eye Near:    Bilateral Near:     Physical Exam Constitutional:      General: She is not in acute distress.    Appearance: She is normal weight. She is not ill-appearing, toxic-appearing or diaphoretic.  HENT:     Head: Normocephalic and atraumatic.     Mouth/Throat:     Mouth: Mucous membranes are moist.  Eyes:     Extraocular Movements: Extraocular movements intact.  Cardiovascular:     Rate and Rhythm: Normal rate and regular rhythm.     Heart sounds: Normal heart sounds.  Pulmonary:     Effort: Pulmonary effort is normal.  Abdominal:     General: Abdomen is flat. Bowel sounds are normal. There is no distension. There are no signs of injury.     Palpations: Abdomen is soft. There is no mass.     Tenderness: There is abdominal tenderness in the suprapubic area. There is no right CVA tenderness or left CVA tenderness.     Hernia: No hernia is present.  Genitourinary:    Comments: deferred Skin:    General: Skin is warm and dry.     Capillary Refill: Capillary refill takes less than 2 seconds.  Neurological:     General: No focal deficit present.     Mental Status: She is alert.  Psychiatric:        Mood and Affect: Mood normal.      UC Treatments / Results  Labs (all labs ordered  are listed, but only abnormal results are displayed) Labs Reviewed  POCT URINALYSIS DIP (MANUAL ENTRY) - Normal  POCT URINE PREGNANCY - Normal    EKG   Radiology No results found.  Procedures Procedures (including critical care time)  Medications Ordered in UC Medications - No data to display  Initial Impression / Assessment and Plan / UC Course  I have reviewed the triage vital signs and the nursing notes.  Pertinent labs & imaging results that were available during my care of the patient were reviewed by me and considered in my medical decision making (see chart for details).     LLQ pain Final Clinical Impressions(s) / UC Diagnoses   Final diagnoses:  Pelvic pain in female     Discharge Instructions      You have been diagnosed with pelvic pain.  This may be related to ovulation.  You are encouraged to continue to monitor pain for you are encouraged to follow-up with GYN for further evaluation and ultrasound to rule out ovarian cyst. As discussed you are encouraged to use Tylenol  and/or topical heating pads to help with any pain since other medications can cause heartburn.     ED Prescriptions   None    PDMP not reviewed this encounter.   Eleanore Grey [place], [place] 04/30/23 [phone]
